# Patient Record
Sex: Male | Born: 1965 | Race: White | Hispanic: No | Marital: Single | Smoking: Current every day smoker
Health system: Southern US, Community
[De-identification: ages and names within clinical notes are randomized; demographics above are authoritative.]

## PROBLEM LIST (undated history)

## (undated) DIAGNOSIS — F329 Major depressive disorder, single episode, unspecified: Secondary | ICD-10-CM

## (undated) DIAGNOSIS — I10 Essential (primary) hypertension: Secondary | ICD-10-CM

## (undated) DIAGNOSIS — J449 Chronic obstructive pulmonary disease, unspecified: Secondary | ICD-10-CM

## (undated) DIAGNOSIS — F419 Anxiety disorder, unspecified: Secondary | ICD-10-CM

## (undated) DIAGNOSIS — F209 Schizophrenia, unspecified: Secondary | ICD-10-CM

## (undated) DIAGNOSIS — J189 Pneumonia, unspecified organism: Secondary | ICD-10-CM

## (undated) DIAGNOSIS — T148XXA Other injury of unspecified body region, initial encounter: Secondary | ICD-10-CM

## (undated) DIAGNOSIS — F1010 Alcohol abuse, uncomplicated: Secondary | ICD-10-CM

## (undated) DIAGNOSIS — K759 Inflammatory liver disease, unspecified: Secondary | ICD-10-CM

## (undated) DIAGNOSIS — F32A Depression, unspecified: Secondary | ICD-10-CM

## (undated) HISTORY — PX: APPENDECTOMY: SHX54

## (undated) HISTORY — DX: Alcohol abuse, uncomplicated: F10.10

## (undated) HISTORY — DX: Essential (primary) hypertension: I10

## (undated) HISTORY — PX: TONSILLECTOMY: SUR1361

---
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
## 1989-01-21 DIAGNOSIS — K759 Inflammatory liver disease, unspecified: Secondary | ICD-10-CM

## 1989-01-21 HISTORY — DX: Inflammatory liver disease, unspecified: K75.9

## 2011-06-08 ENCOUNTER — Encounter (HOSPITAL_COMMUNITY): Payer: Self-pay | Admitting: *Deleted

## 2011-06-08 ENCOUNTER — Emergency Department (HOSPITAL_COMMUNITY): Payer: Medicaid Other

## 2011-06-08 ENCOUNTER — Inpatient Hospital Stay (HOSPITAL_COMMUNITY)
Admission: EM | Admit: 2011-06-08 | Discharge: 2011-06-12 | DRG: 513 | Disposition: A | Discharge status: Home / Self Care | Payer: Medicaid Other | Attending: Internal Medicine | Admitting: Internal Medicine

## 2011-06-08 DIAGNOSIS — L03113 Cellulitis of right upper limb: Secondary | ICD-10-CM

## 2011-06-08 DIAGNOSIS — F17200 Nicotine dependence, unspecified, uncomplicated: Secondary | ICD-10-CM | POA: Diagnosis present

## 2011-06-08 DIAGNOSIS — M869 Osteomyelitis, unspecified: Principal | ICD-10-CM

## 2011-06-08 DIAGNOSIS — M65839 Other synovitis and tenosynovitis, unspecified forearm: Secondary | ICD-10-CM | POA: Diagnosis present

## 2011-06-08 DIAGNOSIS — F411 Generalized anxiety disorder: Secondary | ICD-10-CM | POA: Diagnosis present

## 2011-06-08 DIAGNOSIS — J449 Chronic obstructive pulmonary disease, unspecified: Secondary | ICD-10-CM | POA: Diagnosis present

## 2011-06-08 DIAGNOSIS — F3289 Other specified depressive episodes: Secondary | ICD-10-CM | POA: Diagnosis present

## 2011-06-08 DIAGNOSIS — B1910 Unspecified viral hepatitis B without hepatic coma: Secondary | ICD-10-CM | POA: Diagnosis present

## 2011-06-08 DIAGNOSIS — L02519 Cutaneous abscess of unspecified hand: Secondary | ICD-10-CM

## 2011-06-08 DIAGNOSIS — L03119 Cellulitis of unspecified part of limb: Secondary | ICD-10-CM | POA: Diagnosis present

## 2011-06-08 DIAGNOSIS — F32A Depression, unspecified: Secondary | ICD-10-CM | POA: Diagnosis present

## 2011-06-08 DIAGNOSIS — F209 Schizophrenia, unspecified: Secondary | ICD-10-CM | POA: Diagnosis present

## 2011-06-08 DIAGNOSIS — J4489 Other specified chronic obstructive pulmonary disease: Secondary | ICD-10-CM | POA: Diagnosis present

## 2011-06-08 DIAGNOSIS — A4902 Methicillin resistant Staphylococcus aureus infection, unspecified site: Secondary | ICD-10-CM | POA: Diagnosis present

## 2011-06-08 DIAGNOSIS — F329 Major depressive disorder, single episode, unspecified: Secondary | ICD-10-CM | POA: Diagnosis present

## 2011-06-08 HISTORY — DX: Major depressive disorder, single episode, unspecified: F32.9

## 2011-06-08 HISTORY — DX: Anxiety disorder, unspecified: F41.9

## 2011-06-08 HISTORY — PX: I & D EXTREMITY: SHX5045

## 2011-06-08 HISTORY — DX: Depression, unspecified: F32.A

## 2011-06-08 HISTORY — DX: Other injury of unspecified body region, initial encounter: T14.8XXA

## 2011-06-08 HISTORY — DX: Pneumonia, unspecified organism: J18.9

## 2011-06-08 HISTORY — DX: Inflammatory liver disease, unspecified: K75.9

## 2011-06-08 HISTORY — DX: Chronic obstructive pulmonary disease, unspecified: J44.9

## 2011-06-08 HISTORY — DX: Schizophrenia, unspecified: F20.9

## 2011-06-08 LAB — BASIC METABOLIC PANEL
BUN: 14 mg/dL (ref 6–23)
Chloride: 105 mEq/L (ref 96–112)
Creatinine, Ser: 1.17 mg/dL (ref 0.50–1.35)
GFR calc Af Amer: 84 mL/min — ABNORMAL LOW (ref >90)
GFR calc non Af Amer: 73 mL/min — ABNORMAL LOW (ref >90)
Glucose, Bld: 176 mg/dL — ABNORMAL HIGH (ref 70–99)
Potassium: 4.3 mEq/L (ref 3.5–5.1)

## 2011-06-08 LAB — CBC
HCT: 44.8 % (ref 39.0–52.0)
Hemoglobin: 15.2 g/dL (ref 13.0–17.0)
MCH: 27.4 pg (ref 26.0–34.0)
MCHC: 33.9 g/dL (ref 30.0–36.0)
RBC: 5.54 MIL/uL (ref 4.22–5.81)

## 2011-06-08 LAB — DIFFERENTIAL
Basophils Relative: 0 % (ref 0–1)
Eosinophils Absolute: 0.5 10*3/uL (ref 0.0–0.7)
Lymphs Abs: 1.5 10*3/uL (ref 0.7–4.0)
Monocytes Absolute: 0.7 10*3/uL (ref 0.1–1.0)
Monocytes Relative: 7 % (ref 3–12)
Neutrophils Relative %: 72 % (ref 43–77)

## 2011-06-08 LAB — WOUND CULTURE: Gram Stain: NONE SEEN

## 2011-06-08 MED ORDER — PIPERACILLIN-TAZOBACTAM 3.375 G IVPB
3.3750 g | Freq: Once | INTRAVENOUS | Status: AC
  Administered 2011-06-08: 3.375 g via INTRAVENOUS
  Filled 2011-06-08: qty 50

## 2011-06-08 MED ORDER — MORPHINE SULFATE 4 MG/ML IJ SOLN
4.0000 mg | Freq: Once | INTRAMUSCULAR | Status: AC
  Administered 2011-06-08: 4 mg via INTRAVENOUS
  Filled 2011-06-08: qty 1

## 2011-06-08 MED ORDER — OLANZAPINE 10 MG PO TABS
20.0000 mg | ORAL_TABLET | Freq: Two times a day (BID) | ORAL | Status: DC
  Administered 2011-06-08 – 2011-06-12 (×7): 20 mg via ORAL
  Filled 2011-06-08 (×9): qty 2

## 2011-06-08 MED ORDER — MIRTAZAPINE 15 MG PO TABS
15.0000 mg | ORAL_TABLET | Freq: Every day | ORAL | Status: DC
  Administered 2011-06-09 – 2011-06-11 (×3): 15 mg via ORAL
  Filled 2011-06-08 (×4): qty 1

## 2011-06-08 MED ORDER — ALBUTEROL SULFATE (5 MG/ML) 0.5% IN NEBU
2.5000 mg | INHALATION_SOLUTION | RESPIRATORY_TRACT | Status: DC | PRN
  Administered 2011-06-10 – 2011-06-12 (×6): 2.5 mg via RESPIRATORY_TRACT
  Filled 2011-06-08 (×6): qty 0.5

## 2011-06-08 MED ORDER — BACLOFEN 10 MG PO TABS
10.0000 mg | ORAL_TABLET | Freq: Three times a day (TID) | ORAL | Status: DC
  Administered 2011-06-08 – 2011-06-12 (×10): 10 mg via ORAL
  Filled 2011-06-08 (×13): qty 1

## 2011-06-08 MED ORDER — ONDANSETRON HCL 4 MG PO TABS: 4.0000 mg | ORAL_TABLET | Freq: Four times a day (QID) | ORAL | Status: DC | PRN

## 2011-06-08 MED ORDER — SALSALATE 500 MG PO TABS
500.0000 mg | ORAL_TABLET | Freq: Three times a day (TID) | ORAL | Status: DC
  Administered 2011-06-08 – 2011-06-12 (×10): 500 mg via ORAL
  Filled 2011-06-08 (×13): qty 1

## 2011-06-08 MED ORDER — GABAPENTIN 300 MG PO CAPS
300.0000 mg | ORAL_CAPSULE | Freq: Four times a day (QID) | ORAL | Status: DC
  Administered 2011-06-08 – 2011-06-12 (×13): 300 mg via ORAL
  Filled 2011-06-08 (×17): qty 1

## 2011-06-08 MED ORDER — OXYCODONE HCL 5 MG PO TABS
5.0000 mg | ORAL_TABLET | ORAL | Status: DC | PRN
  Administered 2011-06-09 – 2011-06-11 (×6): 5 mg via ORAL
  Filled 2011-06-08 (×5): qty 1

## 2011-06-08 MED ORDER — ACETAMINOPHEN 650 MG RE SUPP: 650.0000 mg | Freq: Four times a day (QID) | RECTAL | Status: DC | PRN

## 2011-06-08 MED ORDER — ONDANSETRON HCL 4 MG/2ML IJ SOLN: 4.0000 mg | Freq: Four times a day (QID) | INTRAMUSCULAR | Status: DC | PRN

## 2011-06-08 MED ORDER — VANCOMYCIN HCL IN DEXTROSE 1-5 GM/200ML-% IV SOLN
1000.0000 mg | Freq: Once | INTRAVENOUS | Status: AC
  Administered 2011-06-08: 1000 mg via INTRAVENOUS
  Filled 2011-06-08: qty 200

## 2011-06-08 MED ORDER — CITALOPRAM HYDROBROMIDE 20 MG PO TABS
20.0000 mg | ORAL_TABLET | Freq: Every day | ORAL | Status: DC
  Administered 2011-06-10 – 2011-06-12 (×3): 20 mg via ORAL
  Filled 2011-06-08 (×4): qty 1

## 2011-06-08 MED ORDER — VANCOMYCIN HCL 1000 MG IV SOLR
750.0000 mg | Freq: Two times a day (BID) | INTRAVENOUS | Status: DC
  Administered 2011-06-09 – 2011-06-12 (×7): 750 mg via INTRAVENOUS
  Filled 2011-06-08 (×9): qty 750

## 2011-06-08 MED ORDER — ACETAMINOPHEN 325 MG PO TABS
650.0000 mg | ORAL_TABLET | Freq: Four times a day (QID) | ORAL | Status: DC | PRN
  Administered 2011-06-09 – 2011-06-11 (×2): 650 mg via ORAL
  Filled 2011-06-08 (×2): qty 2

## 2011-06-08 MED ORDER — HYDROMORPHONE HCL PF 1 MG/ML IJ SOLN
2.0000 mg | INTRAMUSCULAR | Status: DC | PRN
  Administered 2011-06-08 – 2011-06-10 (×8): 2 mg via INTRAVENOUS
  Filled 2011-06-08: qty 1
  Filled 2011-06-08: qty 2
  Filled 2011-06-08: qty 1
  Filled 2011-06-08 (×6): qty 2

## 2011-06-08 MED ORDER — LIDOCAINE HCL (PF) 1 % IJ SOLN
INTRAMUSCULAR | Status: AC
  Filled 2011-06-08: qty 5

## 2011-06-08 MED ORDER — SODIUM CHLORIDE 0.9 % IV SOLN
INTRAVENOUS | Status: DC
  Administered 2011-06-08: 22:00:00 via INTRAVENOUS

## 2011-06-08 MED ORDER — PIPERACILLIN-TAZOBACTAM 3.375 G IVPB
3.3750 g | Freq: Three times a day (TID) | INTRAVENOUS | Status: DC
  Administered 2011-06-08 – 2011-06-11 (×8): 3.375 g via INTRAVENOUS
  Filled 2011-06-08 (×11): qty 50

## 2011-06-08 MED ORDER — CHLORPROMAZINE HCL 100 MG PO TABS
100.0000 mg | ORAL_TABLET | Freq: Every day | ORAL | Status: DC
  Administered 2011-06-09 – 2011-06-11 (×3): 100 mg via ORAL
  Filled 2011-06-08 (×4): qty 1

## 2011-06-08 NOTE — H&P (Signed)
PATIENT DETAILS Name: Joseph [name] Age: 46 y.o. Sex: male Date of Birth: 1965/10/25 Admit Date: 06/08/2011 PCP:No primary provider on file.   CHIEF COMPLAINT:  Right pain and swelling  HPI: Patient is a 46 year old Caucasian male with a past medical history of schizophrenia, who presented [facility] hospital emergency room earlier today with complaints of right hand pain and swelling. History of pain, patient has been having some mild erythema and pain of his right hand for at least 5 days now, but upon waking up today he notes that his hand was significantly more swollen and much more painful. He then proceeded to be evaluated in the emergency room at [facility], the emergency room physician that consulted the hand physician on call, after discussion with the on-call and physician, the hospitalist service was then asked to admit this patient. Patient was subsequently transferred to [facility], I've already spoken with hand M.D. on call-Dr. Maxine Glenn will evaluate the patient shortly. Patient denies any bites or stings or trauma to the right hand. He denies any fever. He denies any nausea vomiting or diarrhea.   ALLERGIES:  No Known Allergies  PAST MEDICAL HISTORY: Past Medical History  Diagnosis Date  . Schizophrenia   . Depression   . Anxiety   . Nerve damage     PAST SURGICAL HISTORY: History reviewed. No pertinent past surgical history.  MEDICATIONS AT HOME: Prior to Admission medications   Medication Sig Start Date End Date Taking? Authorizing Provider  baclofen (LIORESAL) 10 MG tablet Take 10 mg by mouth 3 (three) times daily.   Yes Historical Provider, MD  chlorproMAZINE (THORAZINE) 100 MG tablet Take 100 mg by mouth at bedtime.   Yes Historical Provider, MD  citalopram (CELEXA) 20 MG tablet Take 20 mg by mouth daily.   Yes Historical Provider, MD  clonazePAM (KLONOPIN) 0.5 MG tablet Take 0.5 mg by mouth 3 (three) times daily as needed. For anxiety   Yes  Historical Provider, MD  gabapentin (NEURONTIN) 300 MG capsule Take 300 mg by mouth 4 (four) times daily.   Yes Historical Provider, MD  HYDROcodone-acetaminophen (LORTAB) 10-500 MG per tablet Take 1 tablet by mouth at bedtime.   Yes Historical Provider, MD  hydrOXYzine (VISTARIL) 50 MG capsule Take 50-100 mg by mouth at bedtime as needed. For sleep/anxiety   Yes Historical Provider, MD  ibuprofen (ADVIL,MOTRIN) 800 MG tablet Take 800 mg by mouth 3 (three) times daily.   Yes Historical Provider, MD  mirtazapine (REMERON) 15 MG tablet Take 15 mg by mouth at bedtime.   Yes Historical Provider, MD  OLANZapine (ZYPREXA) 20 MG tablet Take 20 mg by mouth 2 (two) times daily.   Yes Historical Provider, MD  salsalate (DISALCID) 500 MG tablet Take 500 mg by mouth 3 (three) times daily.   Yes Historical Provider, MD    FAMILY HISTORY: No family history on file.  SOCIAL HISTORY:  reports that he has been smoking.  He does not have any smokeless tobacco history on file. He reports that he drinks alcohol. He reports that he does not use illicit drugs.  REVIEW OF SYSTEMS:  Constitutional:   No  weight loss, night sweats,  Fevers, chills, fatigue.  HEENT:    No headaches, Difficulty swallowing,Tooth/dental problems,Sore throat,  No sneezing, itching, ear ache, nasal congestion, post nasal drip,   Cardio-vascular: No chest pain,  Orthopnea, PND, swelling in lower extremities, anasarca, dizziness, palpitations  GI:  No heartburn, indigestion, abdominal pain, nausea, vomiting, diarrhea, change  in bowel habits, loss of appetite  Resp: No shortness of breath with exertion or at rest.  No excess mucus, no productive cough, No non-productive cough,  No coughing up of blood.No change in color of mucus.No wheezing.No chest wall deformity  Skin:  no rash or lesions.  GU:  no dysuria, change in color of urine, no urgency or frequency.  No flank pain.  Musculoskeletal: No joint pain or swelling.    No  back pain.  Psych: No change in mood or affect. No depression or anxiety.  No memory loss.   PHYSICAL EXAM: Blood pressure 149/90, pulse 94, temperature 98.7 F (37.1 C), temperature source Oral, resp. rate 18, height 5\' 5"  (1.651 m), weight 65.772 kg (145 lb), SpO2 93.00%.  General appearance :Awake, alert, not in any distress. Speech Clear. Not toxic Looking HEENT: Atraumatic and Normocephalic, pupils equally reactive to light and accomodation Neck: supple, no JVD. No cervical lymphadenopathy.  Chest:Good air entry bilaterally, no added sounds  CVS: S1 S2 regular, no murmurs.  Abdomen: Bowel sounds present, Non tender and not distended with no gaurding, rigidity or rebound. Extremities: B/L Lower Ext shows no edema, both legs are warm to touch, with  dorsalis pedis pulses palpable. -Right hand area-on the dorsal aspect shows significant swelling, erythema and increased warmth. On the lateral aspect there is an area of fluctuance. Erythema and some swelling extending into the dorsal aspect of the wrist as well. There is decreased range of motion predominantly from pain. Capillary refill is intact. Sensation is intact. Neurology: Awake alert, and oriented X 3, CN II-XII intact, Non focal, Deep Tendon Reflex-2+ all over, plantar's downgoing B/L, sensory exam is grossly intact.  Skin:No Rash Wounds:N/A  LABS ON ADMISSION:   Basename 06/08/11 1339  NA 141  K 4.3  CL 105  CO2 29  GLUCOSE 176*  BUN 14  CREATININE 1.17  CALCIUM 10.1  MG --  PHOS --   No results found for this basename: AST:2,ALT:2,ALKPHOS:2,BILITOT:2,PROT:2,ALBUMIN:2 in the last 72 hours No results found for this basename: LIPASE:2,AMYLASE:2 in the last 72 hours  Basename 06/08/11 1339  WBC 9.7  NEUTROABS 7.0  HGB 15.2  HCT 44.8  MCV 80.9  PLT 199    Basename 06/08/11 1339  CKTOTAL 73  CKMB --  CKMBINDEX --  TROPONINI --   No results found for this basename: DDIMER:2 in the last 72 hours No components  found with this basename: POCBNP:3   RADIOLOGIC STUDIES ON ADMISSION: Dg Hand Complete Right  06/08/2011  *RADIOLOGY REPORT*  Clinical Data:  Increased swelling and warmth of right hand, no known injury  RIGHT HAND - COMPLETE 3+ VIEW  Comparison: None  Findings: Significant soft tissue swelling, particularly at dorsal margin of right hand extending to the ulnar border at the level of the distal fifth metacarpal and fifth MCP joint. Osseous mineralization normal for technique. Joint spaces preserved. No acute fracture or dislocation. Flexion deformities of the DIP joints of the right index and little fingers  Focal area of bony destruction identified at ulnar margin of distal fifth metacarpal. Distal aspect of lesion is 3 mm beyond the articular margin, atypically removed from the joint space for gout. Overall lesion measures approximately 10 x 5 mm in size and shows a questionable tiny Codman triangle at distal margin. No other regional bony abnormalities identified.  IMPRESSION: Flexion deformities of the ring and little fingers, question related to remote tendon injury or current pathology, recommend clinical correlation. Marked soft tissue swelling.  Lytic osseous lesion identified at ulnar margin, distal right fifth metacarpal. This could represent a bone tumor, with appearance less typical for osteomyelitis or gout. The soft tissue swelling may or may not be related to the observed fifth metacarpal bony lesion. Consider MR imaging of the right hand with and without contrast to further evaluate.  Original Report Authenticated By: Lollie Marrow, M.D.    ASSESSMENT AND PLAN: Present on Admission:   #1 right hand infection -Given fluctuance-likely underlying abscess. -Await hand surgery evaluation, in the meantime keep patient n.p.o. -We'll continue with vancomycin and Zosyn-dosed per pharmacy  #2 schizophrenia -Stable -Hold all oral antipsychotics for now as patient n.p.o. for potential  incision  and drainage. Resume when I&D is done.  Further plan will depend as patient's clinical course evolves and further radiologic and laboratory data become available. Patient will be monitored closely.  DVT Prophylaxis: Bilateral SCDs for now.   Code Status: full code   Total time spent for admission equals 45 minutes.  Jeoffrey Massed 06/08/2011, 9:16 PM

## 2011-06-08 NOTE — H&P (Signed)
Reason for Consult:infection R hand Referring Physician: [facility] ER  Joseph [name] is an 46 y.o. left handed male.  HPI: Pt states R hand swollen and painful since Friday, progressively worse, denies wound, denies trauma, denies fever / chills.  Presented to [facility], transferred to [facility], has rec'd Zosyn, Vanc.  Past Medical History  Diagnosis Date  . Schizophrenia   . Depression   . Anxiety   . Nerve damage     PSH: R hand - ttendon repair with flexion contractures, Appendectomy, L knee sx  No family history on file.  Social History:  reports that he has been smoking.  He does not have any smokeless tobacco history on file. He reports that he drinks alcohol. He reports that he does not use illicit drugs.  Allergies: No Known Allergies  Medications: I have reviewed the patient's current medications.  Results for orders placed during the hospital encounter of 06/08/11 (from the past 48 hour(s))  CBC     Status: Normal   Collection Time   06/08/11  1:39 PM      Component Value Range Comment   WBC 9.7  4.0 - 10.5 (K/uL)    RBC 5.54  4.22 - 5.81 (MIL/uL)    Hemoglobin 15.2  13.0 - 17.0 (g/dL)    HCT 16.1  09.6 - 04.5 (%)    MCV 80.9  78.0 - 100.0 (fL)    MCH 27.4  26.0 - 34.0 (pg)    MCHC 33.9  30.0 - 36.0 (g/dL)    RDW 40.9  81.1 - 91.4 (%)    Platelets 199  150 - 400 (K/uL)   DIFFERENTIAL     Status: Normal   Collection Time   06/08/11  1:39 PM      Component Value Range Comment   Neutrophils Relative 72  43 - 77 (%)    Neutro Abs 7.0  1.7 - 7.7 (K/uL)    Lymphocytes Relative 16  12 - 46 (%)    Lymphs Abs 1.5  0.7 - 4.0 (K/uL)    Monocytes Relative 7  3 - 12 (%)    Monocytes Absolute 0.7  0.1 - 1.0 (K/uL)    Eosinophils Relative 5  0 - 5 (%)    Eosinophils Absolute 0.5  0.0 - 0.7 (K/uL)    Basophils Relative 0  0 - 1 (%)    Basophils Absolute 0.0  0.0 - 0.1 (K/uL)   BASIC METABOLIC PANEL     Status: Abnormal   Collection Time   06/08/11  1:39 PM      Component Value Range Comment    Sodium 141  135 - 145 (mEq/L)    Potassium 4.3  3.5 - 5.1 (mEq/L)    Chloride 105  96 - 112 (mEq/L)    CO2 29  19 - 32 (mEq/L)    Glucose, Bld 176 (*) 70 - 99 (mg/dL)    BUN 14  6 - 23 (mg/dL)    Creatinine, Ser 7.82  0.50 - 1.35 (mg/dL)    Calcium 95.6  8.4 - 10.5 (mg/dL)    GFR calc non Af Amer 73 (*) >90 (mL/min)    GFR calc Af Amer 84 (*) >90 (mL/min)   CK     Status: Normal   Collection Time   06/08/11  1:39 PM      Component Value Range Comment   Total CK 73  7 - 232 (U/L)   LACTIC ACID, PLASMA     Status: Normal  Collection Time   06/08/11  1:42 PM      Component Value Range Comment   Lactic Acid, Venous 1.2  0.5 - 2.2 (mmol/L)     Dg Hand Complete Right  06/08/2011  *RADIOLOGY REPORT*  Clinical Data:  Increased swelling and warmth of right hand, no known injury  RIGHT HAND - COMPLETE 3+ VIEW  Comparison: None  Findings: Significant soft tissue swelling, particularly at dorsal margin of right hand extending to the ulnar border at the level of the distal fifth metacarpal and fifth MCP joint. Osseous mineralization normal for technique. Joint spaces preserved. No acute fracture or dislocation. Flexion deformities of the DIP joints of the right index and little fingers  Focal area of bony destruction identified at ulnar margin of distal fifth metacarpal. Distal aspect of lesion is 3 mm beyond the articular margin, atypically removed from the joint space for gout. Overall lesion measures approximately 10 x 5 mm in size and shows a questionable tiny Codman triangle at distal margin. No other regional bony abnormalities identified.  IMPRESSION: Flexion deformities of the ring and little fingers, question related to remote tendon injury or current pathology, recommend clinical correlation. Marked soft tissue swelling. Lytic osseous lesion identified at ulnar margin, distal right fifth metacarpal. This could represent a bone tumor, with appearance less typical for osteomyelitis or gout. The  soft tissue swelling may or may not be related to the observed fifth metacarpal bony lesion. Consider MR imaging of the right hand with and without contrast to further evaluate.  Original Report Authenticated By: Lollie Marrow, M.D.    Constitutional: negative Eyes: negative for icterus Respiratory: positive for cough, dyspnea on exertion and sputum Cardiovascular: negative Gastrointestinal: negative Musculoskeletal:positive for stiff joints and h/o l knee surgery, h/o R hand surgery with flexion contractures Neurological: positive for paresthesia Behavioral/Psych: positive for depression and schizophrenia Temp:  [98.6 F (37 C)-98.7 F (37.1 C)] 98.7 F (37.1 C) (01/16 2000) Pulse Rate:  [94-104] 94  (01/16 2000) Resp:  [18] 18  (01/16 2000) BP: (149-162)/(89-90) 149/90 mmHg (01/16 2000) SpO2:  [93 %-97 %] 93 % (01/16 2000) Weight:  [65.772 kg (145 lb)] 65.772 kg (145 lb) (01/16 1241) General appearance: alert, appears older than stated age and no distress Resp: diminished breath sounds anterior - bilateral and bilaterally Cardio: regular rate and rhythm GI: normal findings: bowel sounds normal and soft, non-tender Extremities: edema R hand with moderate edema, pain to palpation mostly on dorsum, fluctuance just above 5th mc head, flexion contractures of fingers, iwth volar scar(flexor tendon repair), fingertips pink, gross sensation intact forearm with mild erythema   Assessment/Plan: Cellulitis, abscess, tenosynovitis R hand  Will i&d at bedside, pack, if no better will formally i&d in OR in am, cont BS abx  Joseph [name] Joseph [name] 06/08/2011, 9:49 PM

## 2011-06-08 NOTE — ED Notes (Signed)
Rt hand swollen, red, painful, no injury.

## 2011-06-08 NOTE — Progress Notes (Signed)
ANTIBIOTIC CONSULT NOTE - INITIAL  Pharmacy Consult for Vancomycin and Zosyn Indication: left hand infection  Assessment: 46 yo M who presented to the [facility] ED for R hand pain and swelling for at least 5 days. Hand surgeon has been consulted and plans for I&D at bedside tomorrow.  Patient received a dose of Zosyn 3.375 g IV at 15:09 and vancomycin 1000 mg IV at 13:53 today.  Goal of Therapy:  Vancomycin trough level 10-15 mcg/ml  Plan:  1. Vancomycin 750 mg IV q12h 2. Zosyn 3.375 g IV q8h 3. Will follow-up renal fxn and any culture data  No Known Allergies  Patient Measurements: Height: 5\' 5"  (165.1 cm) Weight: 145 lb (65.772 kg) IBW/kg (Calculated) : 61.5   Vital Signs: Temp: 98.7 F (37.1 C) (01/16 2000) Temp src: Oral (01/16 2000) BP: 149/90 mmHg (01/16 2000) Pulse Rate: 94  (01/16 2000) Intake/Output from previous day:   Intake/Output from this shift:    Labs:  Basename 06/08/11 1339  WBC 9.7  HGB 15.2  PLT 199  LABCREA --  CREATININE 1.17   Estimated Creatinine Clearance: 67.9 ml/min (by C-G formula based on Cr of 1.17). No results found for this basename: VANCOTROUGH:2,VANCOPEAK:2,VANCORANDOM:2,GENTTROUGH:2,GENTPEAK:2,GENTRANDOM:2,TOBRATROUGH:2,TOBRAPEAK:2,TOBRARND:2,AMIKACINPEAK:2,AMIKACINTROU:2,AMIKACIN:2, in the last 72 hours   Microbiology: No results found for this or any previous visit (from the past 720 hour(s)).  Medical History: Past Medical History  Diagnosis Date  . Schizophrenia   . Depression   . Anxiety   . Nerve damage     Medications:  Prescriptions prior to admission  Medication Sig Dispense Refill  . baclofen (LIORESAL) 10 MG tablet Take 10 mg by mouth 3 (three) times daily.      . chlorproMAZINE (THORAZINE) 100 MG tablet Take 100 mg by mouth at bedtime.      . citalopram (CELEXA) 20 MG tablet Take 20 mg by mouth daily.      . clonazePAM (KLONOPIN) 0.5 MG tablet Take 0.5 mg by mouth 3 (three) times daily as needed. For anxiety       . gabapentin (NEURONTIN) 300 MG capsule Take 300 mg by mouth 4 (four) times daily.      Marland Kitchen HYDROcodone-acetaminophen (LORTAB) 10-500 MG per tablet Take 1 tablet by mouth at bedtime.      . hydrOXYzine (VISTARIL) 50 MG capsule Take 50-100 mg by mouth at bedtime as needed. For sleep/anxiety      . ibuprofen (ADVIL,MOTRIN) 800 MG tablet Take 800 mg by mouth 3 (three) times daily.      . mirtazapine (REMERON) 15 MG tablet Take 15 mg by mouth at bedtime.      Marland Kitchen OLANZapine (ZYPREXA) 20 MG tablet Take 20 mg by mouth 2 (two) times daily.      . salsalate (DISALCID) 500 MG tablet Take 500 mg by mouth 3 (three) times daily.       Scheduled:    . baclofen  10 mg Oral TID  . chlorproMAZINE  100 mg Oral QHS  . citalopram  20 mg Oral Daily  . gabapentin  300 mg Oral QID  . lidocaine      . mirtazapine  15 mg Oral QHS  .  morphine injection  4 mg Intravenous Once  .  morphine injection  4 mg Intravenous Once  .  morphine injection  4 mg Intravenous Once  .  morphine injection  4 mg Intravenous Once  . OLANZapine  20 mg Oral BID  . piperacillin-tazobactam (ZOSYN)  IV  3.375 g Intravenous Once  .  salsalate  500 mg Oral TID  . vancomycin  1,000 mg Intravenous Once    [place] Danielle 06/08/2011,10:05 PM

## 2011-06-08 NOTE — ED Notes (Signed)
Pt left via EMS with [facility]. Report given.

## 2011-06-08 NOTE — ED Provider Notes (Signed)
History     CSN: [id number]  Arrival date & time 06/08/11  1225   First MD Initiated Contact with Patient 06/08/11 1338      Chief Complaint  Patient presents with  . Hand Problem     Patient is a 46 y.o. male presenting with hand pain. The history is provided by the patient and a relative.  Hand Pain This is a new problem. The current episode started more than 2 days ago. The problem occurs constantly. The problem has been gradually worsening. Pertinent negatives include no chest pain and no abdominal pain. The symptoms are aggravated by bending and twisting. The symptoms are relieved by nothing.  denies fever/chills/weakness/vomiting Denies trauma to hand No known injuries/bites/stings Reports distant h/o surgery to right ring/little finger with chronic deformity  PMH - "mental problems" per patient and family  History reviewed. No pertinent past surgical history.  No family history on file.  History  Substance Use Topics  . Smoking status: Current Everyday Smoker  . Smokeless tobacco: Not on file  . Alcohol Use: Yes      Review of Systems  Cardiovascular: Negative for chest pain.  Gastrointestinal: Negative for abdominal pain.  All other systems reviewed and are negative.    Allergies  Review of patient's allergies indicates no known allergies.  Home Medications  No current outpatient prescriptions on file.  BP 162/89  Pulse 104  Temp(Src) 98.6 F (37 C) (Oral)  Resp 18  Ht 5\' 5"  (1.651 m)  Wt 145 lb (65.772 kg)  BMI 24.13 kg/m2  SpO2 97%  Physical Exam CONSTITUTIONAL: Well developed/well nourished HEAD AND FACE: Normocephalic/atraumatic EYES: EOMI/PERRL ENMT: Mucous membranes moist NECK: supple no meningeal signs SPINE:entire spine nontender CV: S1/S2 noted, no murmurs/rubs/gallops noted LUNGS: coarse BS noted bilaterally, no distress ABDOMEN: soft, nontender, no rebound or guarding GU:no cva tenderness NEURO: Pt is awake/alert, moves all  extremitiesx4 EXTREMITIES: pulses normal, full ROM Erythema/edema noted to dorsal aspect of right hand with tenderness noted.  No open skin is noted.  There is no erythema noted to forearm He has full ROM of all fingers on right hand and distal cap refill less than 2 seconds.  There is some erythema encroaching onto palmar surface No crepitance noted to palpation of hand SKIN: warm, color normal PSYCH: no abnormalities of mood noted  ED Course  Procedures   Labs Reviewed  CBC  DIFFERENTIAL  BASIC METABOLIC PANEL  LACTIC ACID, PLASMA  CK   2:04 PM Pt with erythema/tenderness to right hand, suspect infectious etiology of unclear cause Will start antibiotics while xray/labs pending  3:48 PM Discussed xray and physical exam findings with dr Izora Ribas at [facility] He requests transfer to [facility], and to admit to hospitalist  4:15 PM D/w dr Jerral Ralph, medicine will accept in transfer  MDM  Nursing notes reviewed and considered in documentation All labs/vitals reviewed and considered xrays reviewed and considered         Joya Gaskins, MD 06/08/11 1616

## 2011-06-08 NOTE — ED Notes (Signed)
Pt awaiting transport by [facility]. Report given to [facility]. Transfer forms printed.

## 2011-06-09 ENCOUNTER — Encounter (HOSPITAL_COMMUNITY): Payer: Self-pay | Admitting: General Practice

## 2011-06-09 ENCOUNTER — Inpatient Hospital Stay (HOSPITAL_COMMUNITY): Payer: Medicaid Other | Admitting: Anesthesiology

## 2011-06-09 ENCOUNTER — Encounter (HOSPITAL_COMMUNITY): Payer: Self-pay | Admitting: Anesthesiology

## 2011-06-09 ENCOUNTER — Encounter (HOSPITAL_COMMUNITY)
Admission: EM | Disposition: A | Discharge status: Home / Self Care | Payer: Self-pay | Source: Home / Self Care | Attending: Internal Medicine

## 2011-06-09 DIAGNOSIS — L02519 Cutaneous abscess of unspecified hand: Secondary | ICD-10-CM | POA: Diagnosis present

## 2011-06-09 DIAGNOSIS — F209 Schizophrenia, unspecified: Secondary | ICD-10-CM | POA: Diagnosis present

## 2011-06-09 DIAGNOSIS — F329 Major depressive disorder, single episode, unspecified: Secondary | ICD-10-CM | POA: Diagnosis present

## 2011-06-09 HISTORY — PX: I & D EXTREMITY: SHX5045

## 2011-06-09 LAB — COMPREHENSIVE METABOLIC PANEL
ALT: 26 U/L (ref 0–53)
Albumin: 3.1 g/dL — ABNORMAL LOW (ref 3.5–5.2)
Alkaline Phosphatase: 94 U/L (ref 39–117)
BUN: 14 mg/dL (ref 6–23)
Chloride: 103 mEq/L (ref 96–112)
Glucose, Bld: 134 mg/dL — ABNORMAL HIGH (ref 70–99)
Potassium: 3.8 mEq/L (ref 3.5–5.1)
Sodium: 137 mEq/L (ref 135–145)
Total Bilirubin: 0.7 mg/dL (ref 0.3–1.2)
Total Protein: 6.4 g/dL (ref 6.0–8.3)

## 2011-06-09 LAB — CBC
HCT: 41.4 % (ref 39.0–52.0)
Hemoglobin: 13.7 g/dL (ref 13.0–17.0)
MCHC: 33.1 g/dL (ref 30.0–36.0)
RDW: 14.2 % (ref 11.5–15.5)
WBC: 8.3 10*3/uL (ref 4.0–10.5)

## 2011-06-09 LAB — SURGICAL PCR SCREEN
MRSA, PCR: POSITIVE — AB
Staphylococcus aureus: POSITIVE — AB

## 2011-06-09 LAB — GLUCOSE, CAPILLARY: Glucose-Capillary: 107 mg/dL — ABNORMAL HIGH (ref 70–99)

## 2011-06-09 SURGERY — IRRIGATION AND DEBRIDEMENT EXTREMITY
Anesthesia: General | Site: Hand | Laterality: Right | Wound class: Dirty or Infected

## 2011-06-09 MED ORDER — PROPOFOL 10 MG/ML IV EMUL
INTRAVENOUS | Status: DC | PRN
  Administered 2011-06-09: 200 mg via INTRAVENOUS

## 2011-06-09 MED ORDER — MUPIROCIN 2 % EX OINT
1.0000 "application " | TOPICAL_OINTMENT | Freq: Two times a day (BID) | CUTANEOUS | Status: DC
  Administered 2011-06-09 – 2011-06-12 (×5): 1 via NASAL
  Filled 2011-06-09: qty 22

## 2011-06-09 MED ORDER — LACTATED RINGERS IV SOLN
INTRAVENOUS | Status: DC | PRN
  Administered 2011-06-09 (×2): via INTRAVENOUS

## 2011-06-09 MED ORDER — HYDROMORPHONE HCL PF 1 MG/ML IJ SOLN: 0.2500 mg | INTRAMUSCULAR | Status: DC | PRN

## 2011-06-09 MED ORDER — ONDANSETRON HCL 4 MG/2ML IJ SOLN
INTRAMUSCULAR | Status: DC | PRN
  Administered 2011-06-09: 4 mg via INTRAVENOUS

## 2011-06-09 MED ORDER — BUPIVACAINE HCL (PF) 0.25 % IJ SOLN
INTRAMUSCULAR | Status: DC | PRN
  Administered 2011-06-09: 10 mL

## 2011-06-09 MED ORDER — FENTANYL CITRATE 0.05 MG/ML IJ SOLN
INTRAMUSCULAR | Status: DC | PRN
  Administered 2011-06-09 (×2): 100 ug via INTRAVENOUS

## 2011-06-09 MED ORDER — CHLORHEXIDINE GLUCONATE CLOTH 2 % EX PADS
6.0000 | MEDICATED_PAD | Freq: Every day | CUTANEOUS | Status: DC
  Administered 2011-06-09 – 2011-06-11 (×3): 6 via TOPICAL

## 2011-06-09 MED ORDER — SODIUM CHLORIDE 0.9 % IR SOLN
Status: DC | PRN
  Administered 2011-06-09: 1000 mL

## 2011-06-09 MED ORDER — ONDANSETRON HCL 4 MG/2ML IJ SOLN: 4.0000 mg | Freq: Once | INTRAMUSCULAR | Status: DC | PRN

## 2011-06-09 MED ORDER — LIDOCAINE HCL (CARDIAC) 20 MG/ML IV SOLN
INTRAVENOUS | Status: DC | PRN
  Administered 2011-06-09: 50 mg via INTRAVENOUS

## 2011-06-09 SURGICAL SUPPLY — 40 items
BAG DECANTER FOR FLEXI CONT (MISCELLANEOUS) IMPLANT
BANDAGE ELASTIC 3 VELCRO ST LF (GAUZE/BANDAGES/DRESSINGS) IMPLANT
BANDAGE ELASTIC 4 VELCRO ST LF (GAUZE/BANDAGES/DRESSINGS) IMPLANT
BANDAGE GAUZE ELAST BULKY 4 IN (GAUZE/BANDAGES/DRESSINGS) ×2 IMPLANT
BNDG ELASTIC 2 VLCR STRL LF (GAUZE/BANDAGES/DRESSINGS) IMPLANT
CLOTH BEACON ORANGE TIMEOUT ST (SAFETY) ×2 IMPLANT
CORDS BIPOLAR (ELECTRODE) IMPLANT
CUFF TOURNIQUET SINGLE 18IN (TOURNIQUET CUFF) IMPLANT
DRAPE SURG 17X23 STRL (DRAPES) ×2 IMPLANT
DRSG XEROFORM 1X8 (GAUZE/BANDAGES/DRESSINGS) ×2 IMPLANT
ELECT REM PT RETURN 9FT ADLT (ELECTROSURGICAL)
ELECTRODE REM PT RTRN 9FT ADLT (ELECTROSURGICAL) IMPLANT
GAUZE PACKING IODOFORM 1/4X5 (PACKING) ×2 IMPLANT
GAUZE XEROFORM 1X8 LF (GAUZE/BANDAGES/DRESSINGS) ×2 IMPLANT
GLOVE BIO SURGEON STRL SZ 6.5 (GLOVE) ×2 IMPLANT
GLOVE ORTHO TXT STRL SZ7.5 (GLOVE) ×4 IMPLANT
GOWN STRL NON-REIN LRG LVL3 (GOWN DISPOSABLE) ×6 IMPLANT
HANDPIECE INTERPULSE COAX TIP (DISPOSABLE)
KIT BASIN OR (CUSTOM PROCEDURE TRAY) ×2 IMPLANT
KIT ROOM TURNOVER OR (KITS) ×2 IMPLANT
MANIFOLD NEPTUNE II (INSTRUMENTS) ×2 IMPLANT
NEEDLE HYPO 25GX1X1/2 BEV (NEEDLE) ×2 IMPLANT
NS IRRIG 1000ML POUR BTL (IV SOLUTION) ×2 IMPLANT
PACK ORTHO EXTREMITY (CUSTOM PROCEDURE TRAY) ×2 IMPLANT
PAD ARMBOARD 7.5X6 YLW CONV (MISCELLANEOUS) ×4 IMPLANT
PAD CAST 4YDX4 CTTN HI CHSV (CAST SUPPLIES) IMPLANT
PADDING CAST COTTON 4X4 STRL (CAST SUPPLIES)
SET HNDPC FAN SPRY TIP SCT (DISPOSABLE) IMPLANT
SOAP 2 % CHG 4 OZ (WOUND CARE) ×2 IMPLANT
SPONGE GAUZE 4X4 12PLY (GAUZE/BANDAGES/DRESSINGS) ×2 IMPLANT
SPONGE LAP 18X18 X RAY DECT (DISPOSABLE) IMPLANT
SPONGE LAP 4X18 X RAY DECT (DISPOSABLE) ×2 IMPLANT
SUT ETHILON 4 0 PS 2 18 (SUTURE) ×2 IMPLANT
SYR CONTROL 10ML LL (SYRINGE) ×2 IMPLANT
TOWEL OR 17X24 6PK STRL BLUE (TOWEL DISPOSABLE) ×2 IMPLANT
TOWEL OR 17X26 10 PK STRL BLUE (TOWEL DISPOSABLE) ×2 IMPLANT
TUBE ANAEROBIC SPECIMEN COL (MISCELLANEOUS) IMPLANT
TUBE CONNECTING 12X1/4 (SUCTIONS) ×2 IMPLANT
WATER STERILE IRR 1000ML POUR (IV SOLUTION) ×2 IMPLANT
YANKAUER SUCT BULB TIP NO VENT (SUCTIONS) ×2 IMPLANT

## 2011-06-09 NOTE — Anesthesia Preprocedure Evaluation (Addendum)
Anesthesia Evaluation  Patient identified by MRN, date of birth, ID band Patient awake    Reviewed: Allergy & Precautions, H&P , NPO status , Patient's Chart, lab work & pertinent test results, reviewed documented beta blocker date and time   Airway Mallampati: I TM Distance: >3 FB Neck ROM: full    Dental  (+) Edentulous Upper and Edentulous Lower   Pulmonary asthma , pneumonia , COPDCurrent Smoker,  clear to auscultation+ rhonchi        Cardiovascular neg cardio ROS regular Normal    Neuro/Psych PSYCHIATRIC DISORDERS Anxiety Schizophrenia Negative Neurological ROS  Negative Psych ROS   GI/Hepatic negative GI ROS, (+) Hepatitis -, B  Endo/Other  Negative Endocrine ROS  Renal/GU negative Renal ROS  Genitourinary negative   Musculoskeletal   Abdominal   Peds  Hematology negative hematology ROS (+)   Anesthesia Other Findings   Reproductive/Obstetrics                          Anesthesia Physical Anesthesia Plan  ASA: II  Anesthesia Plan: General LMA   Post-op Pain Management:    Induction: Intravenous  Airway Management Planned: LMA  Additional Equipment:   Intra-op Plan:   Post-operative Plan:   Informed Consent: I have reviewed the patients History and Physical, chart, labs and discussed the procedure including the risks, benefits and alternatives for the proposed anesthesia with the patient or authorized representative who has indicated his/her understanding and acceptance.   Dental advisory given  Plan Discussed with: Anesthesiologist, CRNA and Surgeon  Anesthesia Plan Comments:        Anesthesia Quick Evaluation

## 2011-06-09 NOTE — Progress Notes (Signed)
S: pt states hand is still swollen, uncomfortable  O:Blood pressure 112/58, pulse 87, temperature 97.7 F (36.5 C), temperature source Oral, resp. rate 20, height 5\' 5"  (1.651 m), weight 65.772 kg (145 lb), SpO2 93.00%. Results for orders placed during the hospital encounter of 06/08/11  WOUND CULTURE     Status: Normal (Preliminary result)   Collection Time   06/08/11 10:11 PM      Component Value Range Status Comment   Specimen Description WOUND HAND   Final    Special Requests NONE   Final    Gram Stain     Final    Value: NO WBC SEEN     NO SQUAMOUS EPITHELIAL CELLS SEEN     FEW GRAM POSITIVE COCCI     IN PAIRS   Culture NO GROWTH 1 DAY   Final    Report Status PENDING   Incomplete   SURGICAL PCR SCREEN     Status: Abnormal   Collection Time   06/09/11  2:28 AM      Component Value Range Status Comment   MRSA, PCR POSITIVE (*) NEGATIVE  Final    Staphylococcus aureus POSITIVE (*) NEGATIVE  Final    R Hand: swollen, erythema present  A:Cellulitis/Abscess/Tenosynovitis R Hand    P: Will proceed with I&D in OR this am

## 2011-06-09 NOTE — Op Note (Signed)
06/08/2011 - 06/09/2011  10:53 AM  PATIENT:  Joseph [name]  46 y.o. male  PRE-OPERATIVE DIAGNOSIS:  Infected Right Hand  POST-OPERATIVE DIAGNOSIS:  Infected Right Hand  PROCEDURE:  Procedure(s): IRRIGATION AND DEBRIDEMENT EXTREMITY, Drainage of deep abscess, drainage of extensor tendon sheath  SURGEON:  Surgeon(s): Johnette Abraham, MD  ANESTHESIA:   general  SPECIMEN:  No Specimen  FINDINGS:  Deep infection to bone of right 5th metacarpal  DISPOSITION OF SPECIMEN:  n/a  PATIENT DISPOSITION:  PACU - hemodynamically stable.

## 2011-06-09 NOTE — Preoperative (Signed)
Beta Blockers   Reason not to administer Beta Blockers:Not Applicable. No home beta blockers 

## 2011-06-09 NOTE — Progress Notes (Signed)
Utilization Review Completed.Joseph [name] T1/17/2013   

## 2011-06-09 NOTE — Progress Notes (Signed)
Joseph [name]  EAV:[id number]  DOB: Apr 05, 1966  DOA: 06/08/2011  PCP: No primary provider on file.  Subjective: Right hand still swollen with pain and erythema, swelling in the fingers improving  Objective: Weight change:   Intake/Output Summary (Last 24 hours) at 06/09/11 0858 Last data filed at 06/09/11 0657  Gross per 24 hour  Intake    300 ml  Output    500 ml  Net   -200 ml   Blood pressure 112/58, pulse 87, temperature 97.7 F (36.5 C), temperature source Oral, resp. rate 20, height 5\' 5"  (1.651 m), weight 65.772 kg (145 lb), SpO2 93.00%.  Physical Exam: General: Alert and awake, oriented x3, not in any acute distress. HEENT: anicteric sclera, pupils reactive to light and accommodation, EOMI CVS: S1-S2 clear, no murmur rubs or gallops Chest: clear to auscultation bilaterally, no wheezing, rales or rhonchi Abdomen: soft nontender, nondistended, normal bowel sounds, no organomegaly Extremities: no cyanosis, clubbing or edema noted bilaterally lower extremities. Right hand area with significant swelling, erythema extending into the dorsal aspect of the wrist with decreased range of motion. Fingers swelling is decreasing per the patient Neuro: Cranial nerves II-XII intact, no focal neurological deficits  Lab Results: Basic Metabolic Panel:  Lab 06/09/11 7829 06/08/11 1339  NA 137 141  K 3.8 4.3  CL 103 105  CO2 24 29  GLUCOSE 134* 176*  BUN 14 14  CREATININE 1.21 1.17  CALCIUM 8.7 10.1  MG -- --  PHOS -- --   Liver Function Tests:  Lab 06/09/11 0448  AST 19  ALT 26  ALKPHOS 94  BILITOT 0.7  PROT 6.4  ALBUMIN 3.1*   CBC:  Lab 06/09/11 0448 06/08/11 1339  WBC 8.3 9.7  NEUTROABS -- 7.0  HGB 13.7 15.2  HCT 41.4 44.8  MCV 81.2 80.9  PLT 197 199   Cardiac Enzymes:  Lab 06/08/11 1339  CKTOTAL 73  CKMB --  CKMBINDEX --  TROPONINI --   CBG:  Lab 06/09/11 0815  GLUCAP 107*     Micro Results: Recent Results (from the past 240 hour(s))  WOUND CULTURE      Status: Normal (Preliminary result)   Collection Time   06/08/11 10:11 PM      Component Value Range Status Comment   Specimen Description WOUND HAND   Final    Special Requests NONE   Final    Gram Stain     Final    Value: NO WBC SEEN     NO SQUAMOUS EPITHELIAL CELLS SEEN     FEW GRAM POSITIVE COCCI     IN PAIRS   Culture NO GROWTH 1 DAY   Final    Report Status PENDING   Incomplete   SURGICAL PCR SCREEN     Status: Abnormal   Collection Time   06/09/11  2:28 AM      Component Value Range Status Comment   MRSA, PCR POSITIVE (*) NEGATIVE  Final    Staphylococcus aureus POSITIVE (*) NEGATIVE  Final     Studies/Results: Dg Hand Complete Right  06/08/2011  *RADIOLOGY REPORT*  Clinical Data:  Increased swelling and warmth of right hand, no known injury  RIGHT HAND - COMPLETE 3+ VIEW  Comparison: None  Findings: Significant soft tissue swelling, particularly at dorsal margin of right hand extending to the ulnar border at the level of the distal fifth metacarpal and fifth MCP joint. Osseous mineralization normal for technique. Joint spaces preserved. No acute fracture or dislocation. Flexion deformities  of the DIP joints of the right index and little fingers  Focal area of bony destruction identified at ulnar margin of distal fifth metacarpal. Distal aspect of lesion is 3 mm beyond the articular margin, atypically removed from the joint space for gout. Overall lesion measures approximately 10 x 5 mm in size and shows a questionable tiny Codman triangle at distal margin. No other regional bony abnormalities identified.  IMPRESSION: Flexion deformities of the ring and little fingers, question related to remote tendon injury or current pathology, recommend clinical correlation. Marked soft tissue swelling. Lytic osseous lesion identified at ulnar margin, distal right fifth metacarpal. This could represent a bone tumor, with appearance less typical for osteomyelitis or gout. The soft tissue swelling  may or may not be related to the observed fifth metacarpal bony lesion. Consider MR imaging of the right hand with and without contrast to further evaluate.  Original Report Authenticated By: Lollie Marrow, M.D.    Medications: Scheduled Meds:   . baclofen  10 mg Oral TID  . Chlorhexidine Gluconate Cloth  6 each Topical Q0600  . chlorproMAZINE  100 mg Oral QHS  . citalopram  20 mg Oral Daily  . gabapentin  300 mg Oral QID  . lidocaine      . mirtazapine  15 mg Oral QHS  .  morphine injection  4 mg Intravenous Once  .  morphine injection  4 mg Intravenous Once  .  morphine injection  4 mg Intravenous Once  .  morphine injection  4 mg Intravenous Once  . mupirocin ointment  1 application Nasal BID  . OLANZapine  20 mg Oral BID  . piperacillin-tazobactam (ZOSYN)  IV  3.375 g Intravenous Once  . piperacillin-tazobactam (ZOSYN)  IV  3.375 g Intravenous Q8H  . salsalate  500 mg Oral TID  . vancomycin  750 mg Intravenous Q12H  . vancomycin  1,000 mg Intravenous Once   Continuous Infusions:   . sodium chloride 125 mL/hr at 06/08/11 2227     Assessment/Plan: Principal Problem:  *Cellulitis and abscess of hand: - Continue vancomycin and Zosyn IV per pharmacy - Status post bedside I&D per hand surgery (Dr. Izora Ribas), plan for I&D in the OR today   Lytic osseous lesion at ulnar margin on the distal right fifth metacarpal on x-ray: Obtain MRI for further workup to rule out any bone tumor.  Active Problems:  Depression: Stable   Schizophrenia: Stable  DVT Prophylaxis: SCDs  Code Status: Full code  Disposition: Currently not medically ready   LOS: 1 day   Kalei Mckillop M.D. [facility] 06/09/2011, 8:58 AM

## 2011-06-09 NOTE — Anesthesia Postprocedure Evaluation (Signed)
  Anesthesia Post-op Note  Patient: Joseph [name]  Procedure(s) Performed:  IRRIGATION AND DEBRIDEMENT EXTREMITY  Patient Location: PACU  Anesthesia Type: General  Level of Consciousness: awake, oriented, sedated and patient cooperative  Airway and Oxygen Therapy: Patient Spontanous Breathing and Patient connected to nasal cannula oxygen  Post-op Pain: mild  Post-op Assessment: Post-op Vital signs reviewed, Patient's Cardiovascular Status Stable, Respiratory Function Stable, Patent Airway, No signs of Nausea or vomiting and Pain level controlled  Post-op Vital Signs: stable  Complications: No apparent anesthesia complications

## 2011-06-09 NOTE — Transfer of Care (Signed)
Immediate Anesthesia Transfer of Care Note  Patient: Joseph [name]  Procedure(s) Performed:  IRRIGATION AND DEBRIDEMENT EXTREMITY  Patient Location: PACU  Anesthesia Type: General  Level of Consciousness: sedated  Airway & Oxygen Therapy: Patient Spontanous Breathing and Patient connected to nasal cannula oxygen  Post-op Assessment: Report given to PACU RN and Post -op Vital signs reviewed and stable  Post vital signs: Reviewed Filed Vitals:   06/09/11 0843  BP: 112/58  Pulse: 87  Temp: 36.5 C  Resp: 20    Complications: No apparent anesthesia complications

## 2011-06-09 NOTE — Anesthesia Procedure Notes (Signed)
Procedure Name: LMA Insertion Date/Time: 06/09/2011 10:23 AM Performed by: Margaree Mackintosh Pre-anesthesia Checklist: Patient identified, Emergency Drugs available, Suction available, Patient being monitored and Timeout performed Patient Re-evaluated:Patient Re-evaluated prior to inductionOxygen Delivery Method: Circle System Utilized Preoxygenation: Pre-oxygenation with 100% oxygen Intubation Type: IV induction Ventilation: Mask ventilation with difficulty LMA: LMA inserted LMA Size: 4.0 Number of attempts: 1 Placement Confirmation: positive ETCO2 and breath sounds checked- equal and bilateral Tube secured with: Tape

## 2011-06-10 LAB — CBC
Hemoglobin: 12.5 g/dL — ABNORMAL LOW (ref 13.0–17.0)
MCHC: 32.6 g/dL (ref 30.0–36.0)
WBC: 5.6 10*3/uL (ref 4.0–10.5)

## 2011-06-10 LAB — GLUCOSE, CAPILLARY: Glucose-Capillary: 109 mg/dL — ABNORMAL HIGH (ref 70–99)

## 2011-06-10 LAB — BASIC METABOLIC PANEL
Calcium: 8.7 mg/dL (ref 8.4–10.5)
Glucose, Bld: 180 mg/dL — ABNORMAL HIGH (ref 70–99)
Potassium: 3.9 mEq/L (ref 3.5–5.1)
Sodium: 140 mEq/L (ref 135–145)

## 2011-06-10 NOTE — Progress Notes (Signed)
ANTIBIOTIC CONSULT NOTE - FOLLOW UP  Pharmacy Consult for vancomycin/zosyn Indication: right hand abscess/osteomyelitis s/p I&D   Assessment: 46 yo M who presented to the [facility] ED for R hand pain and swelling for at least 5 days. Now s/p I&D yesterday, per post-op report found deep infection to bone of right 5th metacarpal. WBC down to 5.6, no fevers noted, hand wound-few GPC, renal function has been stable. Day 3 vancomycin, with stable renal function no changes to regimen at this time. Will plan on trough next 1-2 days if therapy is to continue.   Goal of Therapy:  Vancomycin trough level 15-20 mcg/ml (osteo)  Plan:  Continue vancomyicn 750 q 12 hours Continue zosyn 3.375 q 8 hours F/u hand wound culture   No Known Allergies  Patient Measurements: Height: 5\' 5"  (165.1 cm) Weight: 145 lb (65.772 kg) IBW/kg (Calculated) : 61.5    Vital Signs: Temp: 98.2 F (36.8 C) (01/18 0610) Temp src: Oral (01/17 2142) BP: 148/86 mmHg (01/18 0610) Pulse Rate: 83  (01/18 0610) Intake/Output from previous day: 01/17 0701 - 01/18 0700 In: 2107.3 [P.O.:360; I.V.:1347.3; IV Piggyback:400] Out: 400 [Urine:400] Intake/Output from this shift:    Labs:  Basename 06/10/11 0545 06/09/11 0448 06/08/11 1339  WBC 5.6 8.3 9.7  HGB 12.5* 13.7 15.2  PLT 185 197 199  LABCREA -- -- --  CREATININE 1.17 1.21 1.17   Estimated Creatinine Clearance: 67.9 ml/min (by C-G formula based on Cr of 1.17). No results found for this basename: VANCOTROUGH:2,VANCOPEAK:2,VANCORANDOM:2,GENTTROUGH:2,GENTPEAK:2,GENTRANDOM:2,TOBRATROUGH:2,TOBRAPEAK:2,TOBRARND:2,AMIKACINPEAK:2,AMIKACINTROU:2,AMIKACIN:2, in the last 72 hours   Microbiology: Recent Results (from the past 720 hour(s))  WOUND CULTURE     Status: Normal (Preliminary result)   Collection Time   06/08/11 10:11 PM      Component Value Range Status Comment   Specimen Description WOUND HAND   Final    Special Requests NONE   Final    Gram Stain     Final      Value: NO WBC SEEN     NO SQUAMOUS EPITHELIAL CELLS SEEN     FEW GRAM POSITIVE COCCI     IN PAIRS   Culture NO GROWTH 1 DAY   Final    Report Status PENDING   Incomplete   SURGICAL PCR SCREEN     Status: Abnormal   Collection Time   06/09/11  2:28 AM      Component Value Range Status Comment   MRSA, PCR POSITIVE (*) NEGATIVE  Final    Staphylococcus aureus POSITIVE (*) NEGATIVE  Final     Anti-infectives     Start     Dose/Rate Route Frequency Ordered Stop   06/09/11 0200   vancomycin (VANCOCIN) 750 mg in sodium chloride 0.9 % 150 mL IVPB        750 mg 150 mL/hr over 60 Minutes Intravenous Every 12 hours 06/08/11 2215     06/08/11 2300  piperacillin-tazobactam (ZOSYN) IVPB 3.375 g       3.375 g 12.5 mL/hr over 240 Minutes Intravenous 3 times per day 06/08/11 2215     06/08/11 1500  piperacillin-tazobactam (ZOSYN) IVPB 3.375 g       3.375 g 12.5 mL/hr over 240 Minutes Intravenous  Once 06/08/11 1457 06/08/11 1909   06/08/11 1345   vancomycin (VANCOCIN) IVPB 1000 mg/200 mL premix        1,000 mg 200 mL/hr over 60 Minutes Intravenous  Once 06/08/11 1339 06/08/11 1453          Joseph [name], Joseph [name]  06/10/2011,8:29 AM

## 2011-06-10 NOTE — Op Note (Signed)
[name], Joseph                 ACCOUNT NO.:  [id number]  MEDICAL RECORD NO.:  [id number]  LOCATION:  5151                         FACILITY:  [facility]  PHYSICIAN:  Johnette Abraham, MD    DATE OF BIRTH:  [birth year]  DATE OF PROCEDURE:  06/09/2011 DATE OF DISCHARGE:                              OPERATIVE REPORT   PREOPERATIVE DIAGNOSIS:  Infection of the right hand deep abscess and osteomyelitis methicillin-resistant Staphylococcus aureus.  POSTOPERATIVE DIAGNOSIS:  Infection of the right hand deep abscess and osteomyelitis methicillin-resistant Staphylococcus aureus.  PROCEDURE:  Incision and drainage of deep abscess, irrigation of the extensor tendon sheath with debridement of bone.  ANESTHESIA:  General.  COMPLICATIONS:  No acute complications.  BLOOD LOSS:  Minimal.  INDICATIONS:  [name] presented to [facility] on transfer from [facility] with signs and symptoms of infection of the right hand.  This was I and D'ed last night, there was a significant amount of purulence which was subsequently cultured MRSA.  His hand is still significantly swollen and requires additional formal incision and drainage.  Consent was obtained.  PROCEDURE:  The patient was taken to the operating room, placed supine on the operating table.  General anesthesia was administered without difficulty.  The arm was elevated, tourniquet was inflated to 250 mmHg. An incision along the ulnar border of the right hand was performed for about 4 cm.  There was a deep pocket directly over the fifth metacarpal head.  There was purulence.  The bone was exposed here.  There was actually some encroachment of the cortex due to the infection, this was curetted out.  The extensor tendon sheath was then opened and thoroughly irrigated with a liter of irrigation solution.  There did not appear to be any other tracking deep infection besides what was previously mentioned.  Afterwards, the tourniquet was released.   Hemostasis was obtained.  The wound was partially closed with interrupted 4-0.  The deep pocket and area adjacent to the bone was packed with quarter-inch iodoform gauze.  Sterile dressing was applied.  The patient tolerated the procedure well, was taken to recovery room in stable condition.     Johnette Abraham, MD     HCC/MEDQ  D:  06/09/2011  T:  06/10/2011  Job:  [id number]

## 2011-06-10 NOTE — Progress Notes (Signed)
S:some hand pain, offers no other complaints  O:Blood pressure 148/86, pulse 83, temperature 98.2 F (36.8 C), temperature source Oral, resp. rate 18, height 5\' 5"  (1.651 m), weight 65.772 kg (145 lb), SpO2 92.00%. Results for orders placed during the hospital encounter of 06/08/11  WOUND CULTURE     Status: Normal (Preliminary result)   Collection Time   06/08/11 10:11 PM      Component Value Range Status Comment   Specimen Description WOUND HAND   Final    Special Requests NONE   Final    Gram Stain     Final    Value: NO WBC SEEN     NO SQUAMOUS EPITHELIAL CELLS SEEN     FEW GRAM POSITIVE COCCI     IN PAIRS   Culture NO GROWTH 1 DAY   Final    Report Status PENDING   Incomplete   SURGICAL PCR SCREEN     Status: Abnormal   Collection Time   06/09/11  2:28 AM      Component Value Range Status Comment   MRSA, PCR POSITIVE (*) NEGATIVE  Final    Staphylococcus aureus POSITIVE (*) NEGATIVE  Final     R Hand:   Dressing changed, packing removed, still some swelling and erythema, no pus from wound  A:s/p ID deep abscess, tenosynovitis R hand    P:follow cultures, prob MRSA, cont antibiotics for now, start dressing changes

## 2011-06-10 NOTE — Progress Notes (Signed)
Covey Baller  YNW:[id number]  DOB: Feb 03, 1966  DOA: 06/08/2011  PCP: No primary provider on file.  Subjective: Right hand pain, improving  Objective: Weight change:   Intake/Output Summary (Last 24 hours) at 06/10/11 1144 Last data filed at 06/10/11 0622  Gross per 24 hour  Intake 1107.33 ml  Output      0 ml  Net 1107.33 ml   Blood pressure 160/94, pulse 87, temperature 97.7 F (36.5 C), temperature source Oral, resp. rate 18, height 5\' 5"  (1.651 m), weight 65.772 kg (145 lb), SpO2 99.00%.  Physical Exam: General: Alert and awake, oriented x3, not in any acute distress. HEENT: anicteric sclera, pupils reactive to light and accommodation, EOMI CVS: S1-S2 clear, no murmur rubs or gallops Chest: clear to auscultation bilaterally, no wheezing, rales or rhonchi Abdomen: soft nontender, nondistended, normal bowel sounds, no organomegaly Extremities: no cyanosis, clubbing or edema noted bilaterally lower extremities. Right hand area dressing intact  Neuro: Cranial nerves II-XII intact, no focal neurological deficits  Lab Results: Basic Metabolic Panel:  Lab 06/10/11 6578 06/09/11 0448  NA 140 137  K 3.9 3.8  CL 105 103  CO2 24 24  GLUCOSE 180* 134*  BUN 10 14  CREATININE 1.17 1.21  CALCIUM 8.7 8.7  MG -- --  PHOS -- --   Liver Function Tests:  Lab 06/09/11 0448  AST 19  ALT 26  ALKPHOS 94  BILITOT 0.7  PROT 6.4  ALBUMIN 3.1*   CBC:  Lab 06/10/11 0545 06/09/11 0448 06/08/11 1339  WBC 5.6 8.3 --  NEUTROABS -- -- 7.0  HGB 12.5* 13.7 --  HCT 38.3* 41.4 --  MCV 81.7 81.2 --  PLT 185 197 --   Cardiac Enzymes:  Lab 06/08/11 1339  CKTOTAL 73  CKMB --  CKMBINDEX --  TROPONINI --   CBG:  Lab 06/10/11 0745 06/09/11 0815  GLUCAP 109* 107*     Micro Results: Recent Results (from the past 240 hour(s))  WOUND CULTURE     Status: Normal (Preliminary result)   Collection Time   06/08/11 10:11 PM      Component Value Range Status Comment   Specimen Description  WOUND HAND   Final    Special Requests NONE   Final    Gram Stain     Final    Value: NO WBC SEEN     NO SQUAMOUS EPITHELIAL CELLS SEEN     FEW GRAM POSITIVE COCCI     IN PAIRS   Culture     Final    Value: ABUNDANT STAPHYLOCOCCUS AUREUS     Note: RIFAMPIN AND GENTAMICIN SHOULD NOT BE USED AS SINGLE DRUGS FOR TREATMENT OF STAPH INFECTIONS.   Report Status PENDING   Incomplete   SURGICAL PCR SCREEN     Status: Abnormal   Collection Time   06/09/11  2:28 AM      Component Value Range Status Comment   MRSA, PCR POSITIVE (*) NEGATIVE  Final    Staphylococcus aureus POSITIVE (*) NEGATIVE  Final     Studies/Results: Dg Hand Complete Right  06/08/2011  *RADIOLOGY REPORT*  Clinical Data:  Increased swelling and warmth of right hand, no known injury  RIGHT HAND - COMPLETE 3+ VIEW  Comparison: None  Findings: Significant soft tissue swelling, particularly at dorsal margin of right hand extending to the ulnar border at the level of the distal fifth metacarpal and fifth MCP joint. Osseous mineralization normal for technique. Joint spaces preserved. No acute fracture or dislocation.  Flexion deformities of the DIP joints of the right index and little fingers  Focal area of bony destruction identified at ulnar margin of distal fifth metacarpal. Distal aspect of lesion is 3 mm beyond the articular margin, atypically removed from the joint space for gout. Overall lesion measures approximately 10 x 5 mm in size and shows a questionable tiny Codman triangle at distal margin. No other regional bony abnormalities identified.  IMPRESSION: Flexion deformities of the ring and little fingers, question related to remote tendon injury or current pathology, recommend clinical correlation. Marked soft tissue swelling. Lytic osseous lesion identified at ulnar margin, distal right fifth metacarpal. This could represent a bone tumor, with appearance less typical for osteomyelitis or gout. The soft tissue swelling may or may not  be related to the observed fifth metacarpal bony lesion. Consider MR imaging of the right hand with and without contrast to further evaluate.  Original Report Authenticated By: Lollie Marrow, M.D.    Medications: Scheduled Meds:    . baclofen  10 mg Oral TID  . Chlorhexidine Gluconate Cloth  6 each Topical Q0600  . chlorproMAZINE  100 mg Oral QHS  . citalopram  20 mg Oral Daily  . gabapentin  300 mg Oral QID  . mirtazapine  15 mg Oral QHS  . mupirocin ointment  1 application Nasal BID  . OLANZapine  20 mg Oral BID  . piperacillin-tazobactam (ZOSYN)  IV  3.375 g Intravenous Q8H  . salsalate  500 mg Oral TID  . vancomycin  750 mg Intravenous Q12H   Continuous Infusions:    . sodium chloride 20 mL/hr at 06/09/11 1053     Assessment/Plan: Principal Problem:  *Cellulitis and abscess of hand: - Cultures growing staph aureus, await sensitivities - Continue vancomycin and Zosyn IV per pharmacy - Status post I&D per hand surgery (Dr. Izora Ribas) x2, follow recommendations   Lytic osseous lesion at ulnar margin on the distal right fifth metacarpal on x-ray: MRI canceled by hand surgery  Active Problems:  Depression: Stable   Schizophrenia: Stable  DVT Prophylaxis: SCDs  Code Status: Full code  Disposition: Currently not medically ready   LOS: 2 days   Alta Goding M.D. [facility] 06/10/2011, 11:44 AM

## 2011-06-10 NOTE — Progress Notes (Signed)
Late entry for 06/09/2011  CSW attempted to assess pt for advanced directives but patient was in surgery. CSW unable to assess, will follow up at a later time.   Catha Gosselin, LCSWA  [phone]  06/09/2011 1200pm

## 2011-06-11 DIAGNOSIS — L089 Local infection of the skin and subcutaneous tissue, unspecified: Secondary | ICD-10-CM

## 2011-06-11 DIAGNOSIS — A4902 Methicillin resistant Staphylococcus aureus infection, unspecified site: Secondary | ICD-10-CM

## 2011-06-11 DIAGNOSIS — M869 Osteomyelitis, unspecified: Secondary | ICD-10-CM

## 2011-06-11 LAB — CBC
Hemoglobin: 14 g/dL (ref 13.0–17.0)
MCH: 27 pg (ref 26.0–34.0)
MCHC: 33.4 g/dL (ref 30.0–36.0)
MCV: 80.7 fL (ref 78.0–100.0)
RBC: 5.19 MIL/uL (ref 4.22–5.81)

## 2011-06-11 LAB — BASIC METABOLIC PANEL
BUN: 10 mg/dL (ref 6–23)
CO2: 22 mEq/L (ref 19–32)
Calcium: 9.3 mg/dL (ref 8.4–10.5)
Creatinine, Ser: 1.06 mg/dL (ref 0.50–1.35)
GFR calc non Af Amer: 82 mL/min — ABNORMAL LOW (ref >90)
Glucose, Bld: 109 mg/dL — ABNORMAL HIGH (ref 70–99)
Sodium: 140 mEq/L (ref 135–145)

## 2011-06-11 MED ORDER — HYDROMORPHONE HCL PF 1 MG/ML IJ SOLN
0.5000 mg | INTRAMUSCULAR | Status: DC | PRN
  Administered 2011-06-11: 1 mg via INTRAVENOUS
  Filled 2011-06-11: qty 1

## 2011-06-11 MED ORDER — OXYCODONE HCL 5 MG PO TABS
5.0000 mg | ORAL_TABLET | ORAL | Status: DC | PRN
  Administered 2011-06-11 – 2011-06-12 (×2): 10 mg via ORAL
  Filled 2011-06-11 (×2): qty 2

## 2011-06-11 MED ORDER — LORAZEPAM 2 MG/ML IJ SOLN: 1.0000 mg | Freq: Four times a day (QID) | INTRAMUSCULAR | Status: DC | PRN

## 2011-06-11 MED ORDER — ACETAMINOPHEN 500 MG PO TABS
500.0000 mg | ORAL_TABLET | Freq: Three times a day (TID) | ORAL | Status: DC
  Administered 2011-06-11 – 2011-06-12 (×3): 500 mg via ORAL
  Filled 2011-06-11 (×6): qty 1

## 2011-06-11 MED ORDER — HYDROMORPHONE HCL PF 1 MG/ML IJ SOLN: 1.0000 mg | INTRAMUSCULAR | Status: DC | PRN

## 2011-06-11 MED ORDER — LORAZEPAM 1 MG PO TABS
1.0000 mg | ORAL_TABLET | Freq: Four times a day (QID) | ORAL | Status: DC | PRN
  Administered 2011-06-11: 1 mg via ORAL
  Filled 2011-06-11: qty 1

## 2011-06-11 NOTE — Progress Notes (Signed)
06/11/11 1357  ondansetron (ZOFRAN) injection 4 mg  Volume (mL) 211    0

## 2011-06-11 NOTE — Consult Note (Signed)
Date of Admission:  06/08/2011  Date of Consult:  06/11/2011  Reason for Consult: MRSA hand abscess, tenosynovitis Referring Physician: Dr. Isidoro Donning   HPI: Joseph [name] is an 46 y.o. male with a past medical history of schizophrenia IVDU prior incarceration, Hepatitis B , who presented [facility] hospital emergency room ear[place] in the week with complaints of right hand pain and swelling. He had  erythema and pain of his right hand for at least 5 days  prior but upon waking up today he notes that his hand was significantly more swollen and much more painful> He also had noticed two boils between scrotum and right thigh. He was admitted to [facility] placed on vancomycin and zosyn and now has undergone I and D of deep abscess, irrigation of the extensor tendon sheath with debridement of bone by Dr. Izora Ribas on April 17th. Intraoperative cultures are shoiwing MRSA and abx have been simplifeid to vancomycin alone.    Past Medical History  Diagnosis Date  . Schizophrenia   . Depression   . Anxiety   . Nerve damage   . Asthma   . COPD (chronic obstructive pulmonary disease)   . Pneumonia   . Chronic bronchitis   . Hepatitis 1990's    "B"  . Schizophrenia     Past Surgical History  Procedure Date  . I&d extremity 06/08/11    right hand  . Tendon repair 1980's    right hand  . Tonsillectomy     "when I was a kid"  . Incision and drainage of wound 1980's    left knee S/P "cut it w/a chainsaw"  . Appendectomy     "when I was young"  ergies:   No Known Allergies   Medications: I have reviewed patients current medications as documented in Epic Anti-infectives     Start     Dose/Rate Route Frequency Ordered Stop   06/09/11 0200   vancomycin (VANCOCIN) 750 mg in sodium chloride 0.9 % 150 mL IVPB        750 mg 150 mL/hr over 60 Minutes Intravenous Every 12 hours 06/08/11 2215     06/08/11 2300   piperacillin-tazobactam (ZOSYN) IVPB 3.375 g  Status:  Discontinued        3.375 g 12.5 mL/hr over  240 Minutes Intravenous 3 times per day 06/08/11 2215 06/11/11 1404   06/08/11 1500  piperacillin-tazobactam (ZOSYN) IVPB 3.375 g       3.375 g 12.5 mL/hr over 240 Minutes Intravenous  Once 06/08/11 1457 06/08/11 1909   06/08/11 1345   vancomycin (VANCOCIN) IVPB 1000 mg/200 mL premix        1,000 mg 200 mL/hr over 60 Minutes Intravenous  Once 06/08/11 1339 06/08/11 1453          Social History:  reports that he has been smoking Cigarettes.  He has a 55.5 pack-year smoking history. He has never used smokeless tobacco. He reports that he drinks about .6 ounces of alcohol per week. He reports that he does not use illicit drugs.  History reviewed. No pertinent family history.  As in HPI and primary teams notes otherwise 12 point review of systems is negative  Blood pressure 137/85, pulse 75, temperature 98.1 F (36.7 C), temperature source Oral, resp. rate 12, height 5\' 5"  (1.651 m), weight 145 lb (65.772 kg), SpO2 95.00%. General: Alert and awake, oriented x3, not in any acute distress. HEENT: anicteric sclera, pupils reactive to light and accommodation, EOMI, oropharynx clear and without exudate CVS  regular rate, normal r,  no murmur rubs or gallops Chest: clear to auscultation bilaterally, no wheezing, rales or rhonchi Abdomen: soft nontender, nondistended, normal bowel sounds, Extremities: right hand with bandage,  Skin: no rashes Neuro: nonfocal, strength and sensation intact   Results for orders placed during the hospital encounter of 06/08/11 (from the past 48 hour(s))  CBC     Status: Abnormal   Collection Time   06/10/11  5:45 AM      Component Value Range Comment   WBC 5.6  4.0 - 10.5 (K/uL)    RBC 4.69  4.22 - 5.81 (MIL/uL)    Hemoglobin 12.5 (*) 13.0 - 17.0 (g/dL)    HCT 16.1 (*) 09.6 - 52.0 (%)    MCV 81.7  78.0 - 100.0 (fL)    MCH 26.7  26.0 - 34.0 (pg)    MCHC 32.6  30.0 - 36.0 (g/dL)    RDW 04.5  40.9 - 81.1 (%)    Platelets 185  150 - 400 (K/uL)   BASIC  METABOLIC PANEL     Status: Abnormal   Collection Time   06/10/11  5:45 AM      Component Value Range Comment   Sodium 140  135 - 145 (mEq/L)    Potassium 3.9  3.5 - 5.1 (mEq/L)    Chloride 105  96 - 112 (mEq/L)    CO2 24  19 - 32 (mEq/L)    Glucose, Bld 180 (*) 70 - 99 (mg/dL)    BUN 10  6 - 23 (mg/dL)    Creatinine, Ser 9.14  0.50 - 1.35 (mg/dL)    Calcium 8.7  8.4 - 10.5 (mg/dL)    GFR calc non Af Amer 73 (*) >90 (mL/min)    GFR calc Af Amer 84 (*) >90 (mL/min)   GLUCOSE, CAPILLARY     Status: Abnormal   Collection Time   06/10/11  7:45 AM      Component Value Range Comment   Glucose-Capillary 109 (*) 70 - 99 (mg/dL)    Comment 1 Notify RN     CBC     Status: Normal   Collection Time   06/11/11  6:45 AM      Component Value Range Comment   WBC 6.7  4.0 - 10.5 (K/uL)    RBC 5.19  4.22 - 5.81 (MIL/uL)    Hemoglobin 14.0  13.0 - 17.0 (g/dL)    HCT 78.2  95.6 - 21.3 (%)    MCV 80.7  78.0 - 100.0 (fL)    MCH 27.0  26.0 - 34.0 (pg)    MCHC 33.4  30.0 - 36.0 (g/dL)    RDW 08.6  57.8 - 46.9 (%)    Platelets 220  150 - 400 (K/uL)   BASIC METABOLIC PANEL     Status: Abnormal   Collection Time   06/11/11  6:45 AM      Component Value Range Comment   Sodium 140  135 - 145 (mEq/L)    Potassium 4.1  3.5 - 5.1 (mEq/L)    Chloride 104  96 - 112 (mEq/L)    CO2 22  19 - 32 (mEq/L)    Glucose, Bld 109 (*) 70 - 99 (mg/dL)    BUN 10  6 - 23 (mg/dL)    Creatinine, Ser 6.29  0.50 - 1.35 (mg/dL)    Calcium 9.3  8.4 - 10.5 (mg/dL)    GFR calc non Af Amer 82 (*) >90 (mL/min)  GFR calc Af Amer >90  >90 (mL/min)   GLUCOSE, CAPILLARY     Status: Abnormal   Collection Time   06/11/11  7:33 AM      Component Value Range Comment   Glucose-Capillary 123 (*) 70 - 99 (mg/dL)    Comment 1 Notify RN         Component Value Date/Time   SDES WOUND HAND 06/08/2011 2211   SPECREQUEST NONE 06/08/2011 2211   CULT  Value: ABUNDANT METHICILLIN RESISTANT STAPHYLOCOCCUS AUREUS Note: RIFAMPIN AND GENTAMICIN  SHOULD NOT BE USED AS SINGLE DRUGS FOR TREATMENT OF STAPH INFECTIONS. CRITICAL RESULT CALLED TO, READ BACK BY AND VERIFIED WITH: JILL MOORE 06/11/11 0945 BY [email or web address] 06/08/2011 2211   REPTSTATUS 06/11/2011 FINAL 06/08/2011 2211   No results found.   Recent Results (from the past 720 hour(s))  WOUND CULTURE     Status: Normal   Collection Time   06/08/11 10:11 PM      Component Value Range Status Comment   Specimen Description WOUND HAND   Final    Special Requests NONE   Final    Gram Stain     Final    Value: NO WBC SEEN     NO SQUAMOUS EPITHELIAL CELLS SEEN     FEW GRAM POSITIVE COCCI     IN PAIRS   Culture     Final    Value: ABUNDANT METHICILLIN RESISTANT STAPHYLOCOCCUS AUREUS     Note: RIFAMPIN AND GENTAMICIN SHOULD NOT BE USED AS SINGLE DRUGS FOR TREATMENT OF STAPH INFECTIONS. CRITICAL RESULT CALLED TO, READ BACK BY AND VERIFIED WITH: JILL MOORE 06/11/11 0945 BY [email or web address]   Report Status 06/11/2011 FINAL   Final    Organism ID, Bacteria METHICILLIN RESISTANT STAPHYLOCOCCUS AUREUS   Final   SURGICAL PCR SCREEN     Status: Abnormal   Collection Time   06/09/11  2:28 AM      Component Value Range Status Comment   MRSA, PCR POSITIVE (*) NEGATIVE  Final    Staphylococcus aureus POSITIVE (*) NEGATIVE  Final      Impression/Recommendation 46  Year old with Hep B, prior IVDU now with hand abscess tenosynovitis and osteomyelitis with MRSA  1) Hand infection: --Ideally would want to treat him with 6 to 8 weeks of parenteral abx but given his IVDU history would likely decide to treate him with a po regimen, either high dose bactrim or doxycycline --will check esr and crp --He will need VERY close fu by Hand surgery and by [place] in [facility] prior to stopping abx  2) Hepatitis B: --resend hepatitis panel hep b dna and hiv ab  3) IP: Contact precatuiosn for MRSA   Thank you so much for this interesting consult,   Acey Lav 06/11/2011, 2:30 PM   [phone] (pager) [phone]  (office)

## 2011-06-11 NOTE — Progress Notes (Signed)
S:hand feels better, dressing being changed tid, packing with iodoform gauze and irrigating wound.  O:Blood pressure 137/85, pulse 75, temperature 98.1 F (36.7 C), temperature source Oral, resp. rate 12, height 5\' 5"  (1.651 m), weight 65.772 kg (145 lb), SpO2 95.00%. Results for orders placed during the hospital encounter of 06/08/11  WOUND CULTURE     Status: Normal   Collection Time   06/08/11 10:11 PM      Component Value Range Status Comment   Specimen Description WOUND HAND   Final    Special Requests NONE   Final    Gram Stain     Final    Value: NO WBC SEEN     NO SQUAMOUS EPITHELIAL CELLS SEEN     FEW GRAM POSITIVE COCCI     IN PAIRS   Culture     Final    Value: ABUNDANT METHICILLIN RESISTANT STAPHYLOCOCCUS AUREUS     Note: RIFAMPIN AND GENTAMICIN SHOULD NOT BE USED AS SINGLE DRUGS FOR TREATMENT OF STAPH INFECTIONS. CRITICAL RESULT CALLED TO, READ BACK BY AND VERIFIED WITH: JILL MOORE 06/11/11 0945 BY [email or web address]   Report Status 06/11/2011 FINAL   Final    Organism ID, Bacteria METHICILLIN RESISTANT STAPHYLOCOCCUS AUREUS   Final   SURGICAL PCR SCREEN     Status: Abnormal   Collection Time   06/09/11  2:28 AM      Component Value Range Status Comment   MRSA, PCR POSITIVE (*) NEGATIVE  Final    Staphylococcus aureus POSITIVE (*) NEGATIVE  Final    R hand: decreased erythema and swelling, wound still open without pus  A:s/p I&D r hand - MRSA   P: cont wound care, packing, irrigation; would cont Vanc for now IV, then transition to oral antibiotics (?Doxycycline), ok for d/c tomorrow (Sunday).

## 2011-06-11 NOTE — Progress Notes (Signed)
Joseph [name]  ZOX:[id number]  DOB: Jun 05, 1965  DOA: 06/08/2011  PCP: No primary provider on file.  Subjective: Right hand swelling improving. Patient noticed to be in and out of periods of confusion, states he he drinks beer off and on but not heavy. He stated that he drank beer before the admission. Also per RN, patient too drowsy after Dilaudid dose.  Objective: Weight change:   Intake/Output Summary (Last 24 hours) at 06/11/11 0908 Last data filed at 06/11/11 0659  Gross per 24 hour  Intake   1550 ml  Output    425 ml  Net   1125 ml   Blood pressure 137/85, pulse 75, temperature 98.1 F (36.7 C), temperature source Oral, resp. rate 12, height 5\' 5"  (1.651 m), weight 65.772 kg (145 lb), SpO2 95.00%.  Physical Exam: General: Alert and awake, oriented x3, not in any acute distress on my exam. HEENT: anicteric sclera, pupils reactive to light and accommodation, EOMI CVS: S1-S2 clear, no murmur rubs or gallops Chest: clear to auscultation bilaterally, no wheezing, rales or rhonchi Abdomen: soft nontender, nondistended, normal bowel sounds, no organomegaly Extremities: Right hand erythema, swelling decreased, significantly improved on the fingers. no cyanosis, clubbing or edema noted bilaterally lower extremities.   Lab Results: Basic Metabolic Panel:  Lab 06/11/11 8119 06/10/11 0545  NA 140 140  K 4.1 3.9  CL 104 105  CO2 22 24  GLUCOSE 109* 180*  BUN 10 10  CREATININE 1.06 1.17  CALCIUM 9.3 8.7  MG -- --  PHOS -- --   Liver Function Tests:  Lab 06/09/11 0448  AST 19  ALT 26  ALKPHOS 94  BILITOT 0.7  PROT 6.4  ALBUMIN 3.1*   CBC:  Lab 06/11/11 0645 06/10/11 0545 06/08/11 1339  WBC 6.7 5.6 --  NEUTROABS -- -- 7.0  HGB 14.0 12.5* --  HCT 41.9 38.3* --  MCV 80.7 81.7 --  PLT 220 185 --   Cardiac Enzymes:  Lab 06/08/11 1339  CKTOTAL 73  CKMB --  CKMBINDEX --  TROPONINI --   CBG:  Lab 06/10/11 0745 06/09/11 0815  GLUCAP 109* 107*     Micro  Results: Recent Results (from the past 240 hour(s))  WOUND CULTURE     Status: Normal (Preliminary result)   Collection Time   06/08/11 10:11 PM      Component Value Range Status Comment   Specimen Description WOUND HAND   Final    Special Requests NONE   Final    Gram Stain     Final    Value: NO WBC SEEN     NO SQUAMOUS EPITHELIAL CELLS SEEN     FEW GRAM POSITIVE COCCI     IN PAIRS   Culture     Final    Value: ABUNDANT STAPHYLOCOCCUS AUREUS     Note: RIFAMPIN AND GENTAMICIN SHOULD NOT BE USED AS SINGLE DRUGS FOR TREATMENT OF STAPH INFECTIONS.   Report Status PENDING   Incomplete   SURGICAL PCR SCREEN     Status: Abnormal   Collection Time   06/09/11  2:28 AM      Component Value Range Status Comment   MRSA, PCR POSITIVE (*) NEGATIVE  Final    Staphylococcus aureus POSITIVE (*) NEGATIVE  Final     Studies/Results: Dg Hand Complete Right  06/08/2011  *RADIOLOGY REPORT*  Clinical Data:  Increased swelling and warmth of right hand, no known injury  RIGHT HAND - COMPLETE 3+ VIEW  Comparison: None  Findings:  Significant soft tissue swelling, particularly at dorsal margin of right hand extending to the ulnar border at the level of the distal fifth metacarpal and fifth MCP joint. Osseous mineralization normal for technique. Joint spaces preserved. No acute fracture or dislocation. Flexion deformities of the DIP joints of the right index and little fingers  Focal area of bony destruction identified at ulnar margin of distal fifth metacarpal. Distal aspect of lesion is 3 mm beyond the articular margin, atypically removed from the joint space for gout. Overall lesion measures approximately 10 x 5 mm in size and shows a questionable tiny Codman triangle at distal margin. No other regional bony abnormalities identified.  IMPRESSION: Flexion deformities of the ring and little fingers, question related to remote tendon injury or current pathology, recommend clinical correlation. Marked soft tissue  swelling. Lytic osseous lesion identified at ulnar margin, distal right fifth metacarpal. This could represent a bone tumor, with appearance less typical for osteomyelitis or gout. The soft tissue swelling may or may not be related to the observed fifth metacarpal bony lesion. Consider MR imaging of the right hand with and without contrast to further evaluate.  Original Report Authenticated By: Lollie Marrow, M.D.    Medications: Scheduled Meds:    . baclofen  10 mg Oral TID  . Chlorhexidine Gluconate Cloth  6 each Topical Q0600  . chlorproMAZINE  100 mg Oral QHS  . citalopram  20 mg Oral Daily  . gabapentin  300 mg Oral QID  . mirtazapine  15 mg Oral QHS  . mupirocin ointment  1 application Nasal BID  . OLANZapine  20 mg Oral BID  . piperacillin-tazobactam (ZOSYN)  IV  3.375 g Intravenous Q8H  . salsalate  500 mg Oral TID  . vancomycin  750 mg Intravenous Q12H   Continuous Infusions:    . sodium chloride 20 mL/hr at 06/09/11 1053     Assessment/Plan: Principal Problem:  *Cellulitis and abscess of hand with possible osteomyelitis to the fifth metacarpal: - Cultures growing staph aureus, await sensitivities - Continue vancomycin IV per pharmacy, cultures growing abundant staph aureus, DC Zosyn for now - Status post I&D per hand surgery (Dr. Izora Ribas) x2, follow recommendations. Also discussed with ID (Dr. Algis Liming), patient may require IV antibiotics/vancomycin for 6-8 weeks especially with osteomyelitis and possible MRSA infection.  History of alcohol use with possible alcohol withdrawals/periods of confusion: - Monitor closely with CIWA assessment and when necessary Ativan.  - Also decreased Dilaudid, placed on scheduled Tylenol and continue oxycodone.  Active Problems:  Depression: Stable   Schizophrenia: Stable  DVT Prophylaxis: SCDs  Code Status: Full code  Disposition: Currently not medically ready   LOS: 3 days   Priscilla Finklea M.D. [facility] 06/11/2011, 9:08  AM

## 2011-06-12 LAB — SEDIMENTATION RATE: Sed Rate: 23 mm/hr — ABNORMAL HIGH (ref 0–16)

## 2011-06-12 LAB — GLUCOSE, CAPILLARY

## 2011-06-12 LAB — C-REACTIVE PROTEIN: CRP: 1.07 mg/dL — ABNORMAL HIGH (ref <0.60)

## 2011-06-12 LAB — BASIC METABOLIC PANEL
BUN: 10 mg/dL (ref 6–23)
CO2: 26 mEq/L (ref 19–32)
Calcium: 9.2 mg/dL (ref 8.4–10.5)
GFR calc non Af Amer: 80 mL/min — ABNORMAL LOW (ref >90)
Glucose, Bld: 99 mg/dL (ref 70–99)
Sodium: 141 mEq/L (ref 135–145)

## 2011-06-12 LAB — CBC
HCT: 38.1 % — ABNORMAL LOW (ref 39.0–52.0)
Hemoglobin: 12.7 g/dL — ABNORMAL LOW (ref 13.0–17.0)
MCH: 26.8 pg (ref 26.0–34.0)
MCHC: 33.3 g/dL (ref 30.0–36.0)
MCV: 80.5 fL (ref 78.0–100.0)
RBC: 4.73 MIL/uL (ref 4.22–5.81)

## 2011-06-12 MED ORDER — OXYCODONE HCL 5 MG PO TABS: 5.0000 mg | ORAL_TABLET | ORAL | Status: AC | PRN

## 2011-06-12 MED ORDER — SULFAMETHOXAZOLE-TRIMETHOPRIM 800-160 MG PO TABS: 2.0000 | ORAL_TABLET | Freq: Two times a day (BID) | ORAL | Status: DC

## 2011-06-12 MED ORDER — GABAPENTIN 300 MG PO CAPS: 300.0000 mg | ORAL_CAPSULE | Freq: Four times a day (QID) | ORAL | Status: DC

## 2011-06-12 NOTE — Discharge Summary (Signed)
Physician Discharge Summary  Patient ID: Sidharth Leverette MRN: [id number] DOB/AGE: 48-13-1967 48 y.o.  Admit date: 06/08/2011 Discharge date: 06/12/2011  Primary Care Physician:  No primary provider on file.  Discharge Diagnoses:    .Cellulitis and abscess of hand with MRSA  .Depression .Schizophrenia  Consults:  1. Hand surgery; Dr. Izora Ribas                     2. Infectious disease, Dr. Algis Liming   Discharge Medications: Current Discharge Medication List    START taking these medications   Details  oxyCODONE (OXY IR/ROXICODONE) 5 MG immediate release tablet Take 1-2 tablets (5-10 mg total) by mouth every 4 (four) hours as needed. Qty: 60 tablet, Refills: 0    sulfamethoxazole-trimethoprim (BACTRIM DS) 800-160 MG per tablet Take 2 tablets by mouth 2 (two) times daily. Qty: 120 tablet, Refills: 1      CONTINUE these medications which have CHANGED   Details  gabapentin (NEURONTIN) 300 MG capsule Take 1 capsule (300 mg total) by mouth 4 (four) times daily. Qty: 90 capsule, Refills: 1      CONTINUE these medications which have NOT CHANGED   Details  baclofen (LIORESAL) 10 MG tablet Take 10 mg by mouth 3 (three) times daily.    chlorproMAZINE (THORAZINE) 100 MG tablet Take 100 mg by mouth at bedtime.    citalopram (CELEXA) 20 MG tablet Take 20 mg by mouth daily.    clonazePAM (KLONOPIN) 0.5 MG tablet Take 0.5 mg by mouth 3 (three) times daily as needed. For anxiety    hydrOXYzine (VISTARIL) 50 MG capsule Take 50-100 mg by mouth at bedtime as needed. For sleep/anxiety    ibuprofen (ADVIL,MOTRIN) 800 MG tablet Take 800 mg by mouth 3 (three) times daily.    mirtazapine (REMERON) 15 MG tablet Take 15 mg by mouth at bedtime.    OLANZapine (ZYPREXA) 20 MG tablet Take 20 mg by mouth 2 (two) times daily.    salsalate (DISALCID) 500 MG tablet Take 500 mg by mouth 3 (three) times daily.      STOP taking these medications     HYDROcodone-acetaminophen (LORTAB) 10-500 MG per tablet            Brief H and P: For complete details please refer to admission H and P, but in brief patient is a 46 year old male with past medical history of schizophrenia, [facility] with complaints of right hand pain and swelling for at least 5 days prior to admission. On the day of admission his hand was significantly more swollen and much more painful. Hand surgery was consulted by the emergency room physician and patient was subsequently transferred to [facility] Course:  Principal Problem:  *Cellulitis and abscess of hand - Patient was admitted to the hospitalist service for cellulitis and abscess involving the hand. Hand surgery, Dr. Izora Ribas was consulted. Patient underwent first a bedside I&D on 06/08/2011, subsequently another I&D in the OR on 06/09/2011. Patient underwent I&D of deep abscess, irrigation of the extensor tendon sheath with debridement of the bone to the fifth metacarpal.  Cultures returned positive for MRSA. Patient was continued on initially vancomycin and Zosyn which was narrowed to vancomycin alone. Infectious disease consultation was obtained, patient was seen by Dr. Algis Liming. Ideally patient should be treated with 6-8 weeks of parenteral antibiotics, patient however admits to IV drug use, hence per hand surgery, Dr Izora Ribas and Dr Daiva Eves, ID, he was placed on  high-dose Bactrim twice a day for 2 months. He will followup with Dr. Izora Ribas in 7 days and Dr. Algis Liming in 10 days for followup.  Active Problems:  Depression/ Schizophrenia: Patient was continued on all his psych medications   Day of Discharge BP 138/78  Pulse 80  Temp(Src) 98 F (36.7 C) (Oral)  Resp 17  Ht 5\' 5"  (1.651 m)  Wt 65.772 kg (145 lb)  BMI 24.13 kg/m2  SpO2 100%  Physical Exam: General: Alert and awake oriented x3 not in any acute distress. HEENT: anicteric sclera, pupils reactive to light and accommodation CVS: S1-S2 clear no murmur rubs or  gallops Chest: clear to auscultation bilaterally, no wheezing rales or rhonchi Abdomen: soft nontender, nondistended, normal bowel sounds, no organomegaly Extremities: no cyanosis, clubbing or edema noted bilaterally in LExt, Right hand erythema, swelling decreased, significantly improved on the fingers. no cyanosis, clubbing or edema noted bilaterally lower extremities     The results of significant diagnostics from this hospitalization (including imaging, microbiology, ancillary and laboratory) are listed below for reference.    LAB RESULTS: Basic Metabolic Panel:  Lab 06/11/11 4098 06/11/11 0645  NA 141 140  K 3.9 4.1  CL 106 104  CO2 26 22  GLUCOSE 99 109*  BUN 10 10  CREATININE 1.08 1.06  CALCIUM 9.2 9.3  MG -- --  PHOS -- --   Liver Function Tests:  Lab 06/09/11 0448  AST 19  ALT 26  ALKPHOS 94  BILITOT 0.7  PROT 6.4  ALBUMIN 3.1*   CBC:  Lab 06/11/11 2346 06/11/11 0645 06/08/11 1339  WBC 5.6 6.7 --  NEUTROABS -- -- 7.0  HGB 12.7* 14.0 --  HCT 38.1* 41.9 --  MCV 80.5 -- --  PLT 228 220 --   Cardiac Enzymes:  Lab 06/08/11 1339  CKTOTAL 73  CKMB --  CKMBINDEX --  TROPONINI --   CBG:  Lab 06/12/11 0817 06/11/11 0733  GLUCAP 115* 123*    Significant Diagnostic Studies:  Dg Hand Complete Right  06/08/2011  *RADIOLOGY REPORT*  Clinical Data:  Increased swelling and warmth of right hand, no known injury  RIGHT HAND - COMPLETE 3+ VIEW  Comparison: None  Findings: Significant soft tissue swelling, particularly at dorsal margin of right hand extending to the ulnar border at the level of the distal fifth metacarpal and fifth MCP joint. Osseous mineralization normal for technique. Joint spaces preserved. No acute fracture or dislocation. Flexion deformities of the DIP joints of the right index and little fingers  Focal area of bony destruction identified at ulnar margin of distal fifth metacarpal. Distal aspect of lesion is 3 mm beyond the articular margin,  atypically removed from the joint space for gout. Overall lesion measures approximately 10 x 5 mm in size and shows a questionable tiny Codman triangle at distal margin. No other regional bony abnormalities identified.  IMPRESSION: Flexion deformities of the ring and little fingers, question related to remote tendon injury or current pathology, recommend clinical correlation. Marked soft tissue swelling. Lytic osseous lesion identified at ulnar margin, distal right fifth metacarpal. This could represent a bone tumor, with appearance less typical for osteomyelitis or gout. The soft tissue swelling may or may not be related to the observed fifth metacarpal bony lesion. Consider MR imaging of the right hand with and without contrast to further evaluate.  Original Report Authenticated By: Lollie Marrow, M.D.     Disposition and Follow-up: Discharge Orders    Future Orders Please Complete By  Expires   Diet - low sodium heart healthy      Increase activity slowly      Discharge wound care:      Comments:   Dressing changes TID (three times/day), remove iodoform packing, irrigate with 10cc sterile saline, gently repack, cover with 4x4, kerlix       DISPOSITION: Home  DIET: Heart healthy  ACTIVITY: As tolerated   DISCHARGE FOLLOW-UP Follow-up Information    Follow up with Molinda Bailiff, MD. Schedule an appointment as soon as possible for a visit in 1 week.   Contact information:   [address] [place] [address] [phone]       Follow up with Acey Lav, MD. Schedule an appointment as soon as possible for a visit in 10 days.   Contact information:   [address] [place] [address] [phone]       Follow up with [facility] . [facility] RN for dressing changes)    Contact information:   [phone]         Time spent on Discharge: 45 minutes  Signed:  RAI,RIPUDEEP  M.D. [facility] 06/12/2011, 11:02 AM

## 2011-06-12 NOTE — Progress Notes (Signed)
Instructed brother, Babs Sciara at [phone] how to change dressing on the right hand.  Supplies given to pt/brother to get started until [facility] can get supplies ordered.  Reviewed importance of use of antibiotics with pt and brother and reviewed Rx for oxycodone IR and neurontin.  All discharge instructions reviewed with pt and brother.  Isidoro Donning called and given brother's name and # as well as the pt's correct address.  No further questions verbalized about home care.

## 2011-06-12 NOTE — Progress Notes (Signed)
   CARE MANAGEMENT NOTE 06/12/2011  Patient:  Joseph [name],Joseph [name]   Account Number:  [id number]  Date Initiated:  06/12/2011  Documentation initiated by:  [facility]  Subjective/Objective Assessment:   hand abscess tenosynovitis and osteomyelitis with MRSA     Action/Plan:   lives at home alone, brother lives next door and assist with his care   Anticipated DC Date:  06/12/2011   Anticipated DC Plan:  HOME W HOME HEALTH SERVICES      DC Planning Services  CM consult      [facility] Choice  HOME HEALTH   Choice offered to / List presented to:  C-1 Patient        HH arranged  HH-1 RN      [facility] agency  [facility]   Status of service:  Completed, signed off Medicare Important Message given?   (If response is "NO", the following Medicare IM given date fields will be blank) Date Medicare IM given:   Date Additional Medicare IM given:    Discharge Disposition:  HOME W HOME HEALTH SERVICES  Per UR Regulation:    Comments:  06/12/2011 1045 Gave pt options for [facility] in his area.  Pt decided on [facility] for [facility] services. Contacted [facility] for [facility] RN for dressing changes. Pt has brother who will be teachable. His dressing changes are 3x per day. Isidoro Donning RN CCM Case Mgmt phone [phone]

## 2011-06-13 LAB — HEPATITIS PANEL, ACUTE
HCV Ab: REACTIVE — AB
Hepatitis B Surface Ag: NEGATIVE

## 2011-06-14 ENCOUNTER — Encounter (HOSPITAL_COMMUNITY): Payer: Self-pay | Admitting: General Surgery

## 2011-06-15 LAB — HEPATITIS B DNA, ULTRAQUANTITATIVE, PCR: Hepatitis B DNA: NOT DETECTED IU/mL (ref <20)

## 2011-06-23 ENCOUNTER — Ambulatory Visit (INDEPENDENT_AMBULATORY_CARE_PROVIDER_SITE_OTHER): Payer: Medicaid Other | Admitting: Infectious Disease

## 2011-06-23 ENCOUNTER — Encounter: Payer: Self-pay | Admitting: Infectious Disease

## 2011-06-23 DIAGNOSIS — B1920 Unspecified viral hepatitis C without hepatic coma: Secondary | ICD-10-CM

## 2011-06-23 DIAGNOSIS — L02519 Cutaneous abscess of unspecified hand: Secondary | ICD-10-CM

## 2011-06-23 DIAGNOSIS — F1990 Other psychoactive substance use, unspecified, uncomplicated: Secondary | ICD-10-CM | POA: Insufficient documentation

## 2011-06-23 DIAGNOSIS — A4902 Methicillin resistant Staphylococcus aureus infection, unspecified site: Secondary | ICD-10-CM

## 2011-06-23 DIAGNOSIS — F1910 Other psychoactive substance abuse, uncomplicated: Secondary | ICD-10-CM

## 2011-06-23 LAB — CBC WITH DIFFERENTIAL/PLATELET
Basophils Relative: 1 % (ref 0–1)
Eosinophils Absolute: 0.3 10*3/uL (ref 0.0–0.7)
Hemoglobin: 15.4 g/dL (ref 13.0–17.0)
Lymphs Abs: 2.1 10*3/uL (ref 0.7–4.0)
MCH: 26.9 pg (ref 26.0–34.0)
Monocytes Relative: 12 % (ref 3–12)
Neutro Abs: 2.3 10*3/uL (ref 1.7–7.7)
Neutrophils Relative %: 43 % (ref 43–77)
Platelets: 341 10*3/uL (ref 150–400)
RBC: 5.72 MIL/uL (ref 4.22–5.81)

## 2011-06-23 LAB — BASIC METABOLIC PANEL WITH GFR
Calcium: 9.4 mg/dL (ref 8.4–10.5)
GFR, Est African American: 50 mL/min — ABNORMAL LOW
Potassium: 4.1 mEq/L (ref 3.5–5.3)
Sodium: 138 mEq/L (ref 135–145)

## 2011-06-23 LAB — SEDIMENTATION RATE: Sed Rate: 1 mm/hr (ref 0–16)

## 2011-06-23 LAB — C-REACTIVE PROTEIN: CRP: 0.05 mg/dL (ref <0.60)

## 2011-06-23 NOTE — Assessment & Plan Note (Signed)
He actually has osteomyelitis as well by imaging. Will complete 8 wks of therapy. Check esr, crp and safety labs today

## 2011-06-23 NOTE — Progress Notes (Signed)
Subjective:    Patient ID: Joseph [name], male    DOB: 19-Oct-1965, 46 y.o.   MRN: [id number]  HPI  46 y.o. male with a past medical history of schizophrenia IVDU prior incarceration, Hepatitis B , who presented [facility] hospital emergency room with complaints of right hand pain and swelling. He had erythema and pain of his right hand for at least 5 days prior but upon waking up today he notes that his hand was significantly more swollen and much more painful> He also had noticed two boils between scrotum and right thigh. He was admitted to [facility] placed on vancomycin and zosyn and now has undergone I and D of deep abscess, irrigation of the extensor tendon sheath with debridement of bone by Dr. Izora Ribas on April 17th. Intraoperative cultures  shoiwing MRSA and abx have were simplifeid to vancomycin alone, and he was dc on TMP/SMX DS two bid which he has continued thrrough today with plans of 8 weeks of therapy. HE had improved as an outpatient with reduction in size of wound where it is packed and reduction in pain. No fevers, chills, nausea or malaise.   Review of Systems  Constitutional: Negative for fever, chills, diaphoresis, activity change, appetite change, fatigue and unexpected weight change.  HENT: Negative for congestion, sore throat, rhinorrhea, sneezing, trouble swallowing and sinus pressure.   Eyes: Negative for photophobia and visual disturbance.  Respiratory: Negative for cough, chest tightness, shortness of breath, wheezing and stridor.   Cardiovascular: Negative for chest pain, palpitations and leg swelling.  Gastrointestinal: Negative for nausea, vomiting, abdominal pain, diarrhea, constipation, blood in stool, abdominal distention and anal bleeding.  Genitourinary: Negative for dysuria, hematuria, flank pain and difficulty urinating.  Musculoskeletal: Negative for myalgias, back pain, joint swelling, arthralgias and gait problem.  Skin: Positive for wound. Negative for color change,  pallor and rash.  Neurological: Negative for dizziness, tremors, weakness and light-headedness.  Hematological: Negative for adenopathy. Does not bruise/bleed easily.  Psychiatric/Behavioral: Negative for behavioral problems, confusion, sleep disturbance, dysphoric mood, decreased concentration and agitation.       Objective:   Physical Exam  Constitutional: He is oriented to person, place, and time. He appears well-developed and well-nourished. No distress.  HENT:  Head: Normocephalic and atraumatic.  Mouth/Throat: Oropharynx is clear and moist. No oropharyngeal exudate.  Eyes: Conjunctivae and EOM are normal. Pupils are equal, round, and reactive to light. No scleral icterus.  Neck: Normal range of motion. Neck supple. No JVD present.  Cardiovascular: Normal rate, regular rhythm and normal heart sounds.  Exam reveals no gallop and no friction rub.   No murmur heard. Pulmonary/Chest: Effort normal and breath sounds normal. No respiratory distress. He has no wheezes. He has no rales. He exhibits no tenderness.  Abdominal: He exhibits no distension and no mass. There is no tenderness. There is no rebound and no guarding.  Musculoskeletal: He exhibits no edema and no tenderness.  Lymphadenopathy:    He has no cervical adenopathy.  Neurological: He is alert and oriented to person, place, and time. He has normal reflexes. He exhibits normal muscle tone. Coordination normal.  Skin: Skin is warm and dry. He is not diaphoretic. There is erythema. No pallor.     Psychiatric: He has a normal mood and affect. His behavior is normal. Judgment and thought content normal.          Assessment & Plan:  MRSA (methicillin resistant Staphylococcus aureus) infection Culprit pathogen see plan re abscess, will decolonize after finishing hterapy  Cellulitis and abscess of hand He actually has osteomyelitis as well by imaging. Will complete 8 wks of therapy. Check esr, crp and safety labs  today  Hepatitis C Check genotype and hep c viral load at next visit  IVDU (intravenous drug user) Reportedly in remission

## 2011-06-23 NOTE — Assessment & Plan Note (Signed)
Check genotype and hep c viral load at next visit

## 2011-06-23 NOTE — Assessment & Plan Note (Signed)
Reportedly in remission.  ?

## 2011-06-23 NOTE — Assessment & Plan Note (Signed)
Culprit pathogen see plan re abscess, will decolonize after finishing hterapy

## 2011-06-24 ENCOUNTER — Telehealth: Payer: Self-pay | Admitting: Infectious Disease

## 2011-06-24 DIAGNOSIS — Z22322 Carrier or suspected carrier of Methicillin resistant Staphylococcus aureus: Secondary | ICD-10-CM

## 2011-06-24 MED ORDER — SULFAMETHOXAZOLE-TRIMETHOPRIM 800-160 MG PO TABS: 1.0000 | ORAL_TABLET | Freq: Two times a day (BID) | ORAL | Status: DC

## 2011-06-24 NOTE — Telephone Encounter (Signed)
Called Axtell Goh due his serum creatinine being up to 1.8. This may be a pseudoelevelation due to the tmp/smx.  I have asked him to :  Stop the salsalate Stop the ibuprofen  Decrease the dose of bactrim to ONE ds bid and come back for repeat blood work on Monday

## 2011-06-27 ENCOUNTER — Other Ambulatory Visit: Payer: Medicaid Other

## 2011-06-27 DIAGNOSIS — Z22322 Carrier or suspected carrier of Methicillin resistant Staphylococcus aureus: Secondary | ICD-10-CM

## 2011-06-28 LAB — BASIC METABOLIC PANEL WITH GFR
Chloride: 105 mEq/L (ref 96–112)
GFR, Est African American: 69 mL/min
GFR, Est Non African American: 59 mL/min — ABNORMAL LOW
Potassium: 4.4 mEq/L (ref 3.5–5.3)
Sodium: 138 mEq/L (ref 135–145)

## 2011-08-01 ENCOUNTER — Telehealth: Payer: Self-pay | Admitting: *Deleted

## 2011-08-01 DIAGNOSIS — L02519 Cutaneous abscess of unspecified hand: Secondary | ICD-10-CM

## 2011-08-01 MED ORDER — SULFAMETHOXAZOLE-TRIMETHOPRIM 800-160 MG PO TABS: 2.0000 | ORAL_TABLET | Freq: Two times a day (BID) | ORAL | Status: DC

## 2011-08-01 NOTE — Telephone Encounter (Signed)
[place] Septra [place] 2 tablets 2 times a day.

## 2011-08-11 ENCOUNTER — Encounter: Payer: Self-pay | Admitting: Infectious Disease

## 2011-08-11 ENCOUNTER — Ambulatory Visit (INDEPENDENT_AMBULATORY_CARE_PROVIDER_SITE_OTHER): Payer: Medicaid Other | Admitting: Infectious Disease

## 2011-08-11 VITALS — BP 118/79 | HR 85 | Temp 97.5°F | Wt 172.2 lb

## 2011-08-11 DIAGNOSIS — L03119 Cellulitis of unspecified part of limb: Secondary | ICD-10-CM

## 2011-08-11 DIAGNOSIS — B1920 Unspecified viral hepatitis C without hepatic coma: Secondary | ICD-10-CM

## 2011-08-11 DIAGNOSIS — L02519 Cutaneous abscess of unspecified hand: Secondary | ICD-10-CM

## 2011-08-11 DIAGNOSIS — A4902 Methicillin resistant Staphylococcus aureus infection, unspecified site: Secondary | ICD-10-CM

## 2011-08-11 LAB — CBC WITH DIFFERENTIAL/PLATELET
Eosinophils Absolute: 0.3 10*3/uL (ref 0.0–0.7)
Lymphocytes Relative: 30 % (ref 12–46)
Lymphs Abs: 1.6 10*3/uL (ref 0.7–4.0)
MCH: 27.5 pg (ref 26.0–34.0)
Neutro Abs: 3 10*3/uL (ref 1.7–7.7)
Neutrophils Relative %: 55 % (ref 43–77)
Platelets: 224 10*3/uL (ref 150–400)
RBC: 5.42 MIL/uL (ref 4.22–5.81)
WBC: 5.4 10*3/uL (ref 4.0–10.5)

## 2011-08-11 LAB — BASIC METABOLIC PANEL WITH GFR
Calcium: 8.9 mg/dL (ref 8.4–10.5)
GFR, Est African American: 62 mL/min
GFR, Est Non African American: 53 mL/min — ABNORMAL LOW
Potassium: 4.7 mEq/L (ref 3.5–5.3)
Sodium: 139 mEq/L (ref 135–145)

## 2011-08-11 LAB — C-REACTIVE PROTEIN: CRP: 0.56 mg/dL (ref <0.60)

## 2011-08-11 MED ORDER — MUPIROCIN 2 % EX OINT: TOPICAL_OINTMENT | CUTANEOUS | Status: DC

## 2011-08-11 MED ORDER — CHLORHEXIDINE GLUCONATE 4 % EX LIQD: 60.0000 mL | Freq: Every day | CUTANEOUS | Status: DC

## 2011-08-11 NOTE — Assessment & Plan Note (Signed)
Stop antibiotics. Check esr and crp.

## 2011-08-11 NOTE — Progress Notes (Signed)
Subjective:    Patient ID: Joseph [name], male    DOB: May 12, 1966, 46 y.o.   MRN: [id number]  HPI  46y.o. male with a past medical history of schizophrenia IVDU prior incarceration, Hepatitis B , who presented [facility] hospital emergency room with complaints of right hand pain and swelling. He had erythema and pain of his right hand for at least 5 days prior but upon waking up noted that his hand was significantly more swollen and much more painful> He also had noticed two boils between scrotum and right thigh. He was admitted to [facility] placed on vancomycin and zosyn and  has undergone I and D of deep abscess, irrigation of the extensor tendon sheath with debridement of bone by Dr. Izora Ribas on April 17th. Intraoperative cultures shoiwing MRSA and abx have were simplifeid to vancomycin alone, and he was dc on TMP/SMX DS two bid which he has continued thrrough today with plans of 8 weeks of therapy. HE had improved as an outpatient with reduction in size of wound where was packed with reduction in pain. I saw him in late January. His hand is looking great and appears to have completely resolved infection> Dr. Izora Ribas has dc him from his clinic. Pt still thinks he has hep b not c but labs show ab to C . I will work this up. Also will give him a decolonization regimen for his MRSA.   Review of Systems  Constitutional: Negative for fever, chills, diaphoresis, activity change, appetite change, fatigue and unexpected weight change.  HENT: Negative for congestion, sore throat, rhinorrhea, sneezing, trouble swallowing and sinus pressure.   Eyes: Negative for photophobia and visual disturbance.  Respiratory: Negative for cough, chest tightness, shortness of breath, wheezing and stridor.   Cardiovascular: Negative for chest pain, palpitations and leg swelling.  Gastrointestinal: Negative for nausea, vomiting, abdominal pain, diarrhea, constipation, blood in stool, abdominal distention and anal bleeding.    Genitourinary: Negative for dysuria, hematuria, flank pain and difficulty urinating.  Musculoskeletal: Negative for myalgias, back pain, joint swelling, arthralgias and gait problem.  Skin: Negative for color change, pallor, rash and wound.  Neurological: Negative for dizziness, tremors, weakness and light-headedness.  Hematological: Negative for adenopathy. Does not bruise/bleed easily.  Psychiatric/Behavioral: Negative for behavioral problems, confusion, sleep disturbance, dysphoric mood, decreased concentration and agitation.       Objective:   Physical Exam  Constitutional: He is oriented to person, place, and time. He appears well-developed and well-nourished. No distress.  HENT:  Head: Normocephalic and atraumatic.  Mouth/Throat: Oropharynx is clear and moist. No oropharyngeal exudate.  Eyes: Conjunctivae and EOM are normal. Pupils are equal, round, and reactive to light. No scleral icterus.  Neck: Normal range of motion. Neck supple. No JVD present.  Cardiovascular: Normal rate, regular rhythm and normal heart sounds.  Exam reveals no gallop and no friction rub.   No murmur heard. Pulmonary/Chest: Effort normal and breath sounds normal. No respiratory distress. He has no wheezes. He has no rales. He exhibits no tenderness.  Abdominal: He exhibits no distension and no mass. There is no tenderness. There is no rebound and no guarding.  Musculoskeletal: He exhibits no edema and no tenderness.       Arms: Lymphadenopathy:    He has no cervical adenopathy.  Neurological: He is alert and oriented to person, place, and time. He has normal reflexes. He exhibits normal muscle tone. Coordination normal.  Skin: Skin is warm and dry. He is not diaphoretic. No erythema. No pallor.  Psychiatric: He has a normal mood and affect. His behavior is normal. Judgment and thought content normal.          Assessment & Plan:  Cellulitis and abscess of hand Stop antibiotics. Check esr and crp.    MRSA (methicillin resistant Staphylococcus aureus) infection decolonize  Hepatitis C Check Hep c rna and genotype

## 2011-08-11 NOTE — Assessment & Plan Note (Signed)
Check Hep c rna and genotype

## 2011-08-11 NOTE — Assessment & Plan Note (Signed)
decolonize

## 2011-08-12 LAB — SEDIMENTATION RATE: Sed Rate: 1 mm/hr (ref 0–16)

## 2011-08-15 ENCOUNTER — Ambulatory Visit: Payer: Medicaid Other | Admitting: Emergency Medicine

## 2011-08-16 LAB — HCV RNA QUANT RFLX ULTRA OR GENOTYP
HCV Quantitative Log: 7.39 {Log} — ABNORMAL HIGH (ref <1.63)
HCV Quantitative: 24400218 IU/mL — ABNORMAL HIGH (ref <43)

## 2011-08-17 ENCOUNTER — Other Ambulatory Visit: Payer: Self-pay | Admitting: *Deleted

## 2011-08-17 DIAGNOSIS — A4902 Methicillin resistant Staphylococcus aureus infection, unspecified site: Secondary | ICD-10-CM

## 2011-08-17 LAB — HEPATITIS C GENOTYPE

## 2011-08-17 MED ORDER — MUPIROCIN 2 % EX OINT: TOPICAL_OINTMENT | CUTANEOUS | Status: AC

## 2011-08-17 MED ORDER — CHLORHEXIDINE GLUCONATE 4 % EX LIQD: 60.0000 mL | Freq: Every day | CUTANEOUS | Status: AC

## 2012-01-06 ENCOUNTER — Encounter (HOSPITAL_COMMUNITY): Payer: Self-pay | Admitting: *Deleted

## 2012-01-06 ENCOUNTER — Emergency Department (HOSPITAL_COMMUNITY): Payer: Medicaid Other

## 2012-01-06 ENCOUNTER — Emergency Department (HOSPITAL_COMMUNITY)
Admission: EM | Admit: 2012-01-06 | Discharge: 2012-01-06 | Disposition: A | Discharge status: Home / Self Care | Payer: Medicaid Other | Attending: Emergency Medicine | Admitting: Emergency Medicine

## 2012-01-06 DIAGNOSIS — Z9089 Acquired absence of other organs: Secondary | ICD-10-CM | POA: Insufficient documentation

## 2012-01-06 DIAGNOSIS — R112 Nausea with vomiting, unspecified: Secondary | ICD-10-CM

## 2012-01-06 DIAGNOSIS — F341 Dysthymic disorder: Secondary | ICD-10-CM | POA: Insufficient documentation

## 2012-01-06 DIAGNOSIS — J4489 Other specified chronic obstructive pulmonary disease: Secondary | ICD-10-CM | POA: Insufficient documentation

## 2012-01-06 DIAGNOSIS — Z8659 Personal history of other mental and behavioral disorders: Secondary | ICD-10-CM | POA: Insufficient documentation

## 2012-01-06 DIAGNOSIS — J449 Chronic obstructive pulmonary disease, unspecified: Secondary | ICD-10-CM | POA: Insufficient documentation

## 2012-01-06 DIAGNOSIS — R197 Diarrhea, unspecified: Secondary | ICD-10-CM | POA: Insufficient documentation

## 2012-01-06 DIAGNOSIS — Z79899 Other long term (current) drug therapy: Secondary | ICD-10-CM | POA: Insufficient documentation

## 2012-01-06 DIAGNOSIS — F17200 Nicotine dependence, unspecified, uncomplicated: Secondary | ICD-10-CM | POA: Insufficient documentation

## 2012-01-06 MED ORDER — ONDANSETRON HCL 4 MG PO TABS: 4.0000 mg | ORAL_TABLET | Freq: Four times a day (QID) | ORAL | Status: AC

## 2012-01-06 MED ORDER — HYDROMORPHONE HCL PF 1 MG/ML IJ SOLN
1.0000 mg | Freq: Once | INTRAMUSCULAR | Status: AC
  Administered 2012-01-06: 1 mg via INTRAVENOUS
  Filled 2012-01-06: qty 1

## 2012-01-06 MED ORDER — ONDANSETRON HCL 4 MG/2ML IJ SOLN
4.0000 mg | Freq: Once | INTRAMUSCULAR | Status: AC
  Administered 2012-01-06: 4 mg via INTRAVENOUS
  Filled 2012-01-06: qty 2

## 2012-01-06 MED ORDER — SODIUM CHLORIDE 0.9 % IV BOLUS (SEPSIS)
1000.0000 mL | Freq: Once | INTRAVENOUS | Status: AC
  Administered 2012-01-06: 1000 mL via INTRAVENOUS

## 2012-01-06 NOTE — ED Notes (Signed)
C/o vomiting and unable to keep anything down, also has a knot on right lower leg at surgical site, states he has surgery 6-8 weeks ago for a broken leg. Still wearing a splint, scheduled for recheck next week

## 2012-01-06 NOTE — ED Notes (Signed)
Discharge instructions reviewed.

## 2012-01-13 NOTE — ED Provider Notes (Signed)
History    46yM with n/v/d. Onset about a day ago. Persistent. Multiple episodes of each no blood in stool or vomitus. No fever or chills. No melena. NO urinary complaints. No sick contacts. NO significant abdominal pain. Pt also noticed a knot on his shin. Recent surgery about 2 months ago for tib/fib fx done in [place]. No rash. No acute numbness or tingling.   CSN: [id number]  Arrival date & time 01/06/12  1417   First MD Initiated Contact with Patient 01/06/12 1511      Chief Complaint  Patient presents with  . Emesis    (Consider location/radiation/quality/duration/timing/severity/associated sxs/prior treatment) HPI  Past Medical History  Diagnosis Date  . Schizophrenia   . Depression   . Anxiety   . Nerve damage   . Asthma   . COPD (chronic obstructive pulmonary disease)   . Pneumonia   . Chronic bronchitis   . Hepatitis 1990's    "B"  . Schizophrenia     Past Surgical History  Procedure Date  . I&d extremity 06/08/11    right hand  . Tendon repair 1980's    right hand  . Tonsillectomy     "when I was a kid"  . Incision and drainage of wound 1980's    left knee S/P "cut it w/a chainsaw"  . Appendectomy     "when I was young"  . I&d extremity 06/09/2011    Procedure: IRRIGATION AND DEBRIDEMENT EXTREMITY;  Surgeon: Johnette Abraham, MD;  Location: [facility] OR;  Service: Plastics;  Laterality: Right;    No family history on file.  History  Substance Use Topics  . Smoking status: Current Everyday Smoker -- 1.5 packs/day for 37 years    Types: Cigarettes  . Smokeless tobacco: Never Used  . Alcohol Use: 0.6 oz/week    1 Cans of beer per week      Review of Systems   Review of symptoms negative unless otherwise noted in HPI.   Allergies  Review of patient's allergies indicates no known allergies.  Home Medications   Current Outpatient Rx  Name Route Sig Dispense Refill  . ALBUTEROL SULFATE HFA 108 (90 BASE) MCG/ACT IN AERS Inhalation Inhale 2 puffs  into the lungs every 6 (six) hours as needed.    Marland Kitchen BACLOFEN 10 MG PO TABS Oral Take 10 mg by mouth at bedtime.     . CHLORPROMAZINE HCL 100 MG PO TABS Oral Take 100 mg by mouth at bedtime.    Marland Kitchen CITALOPRAM HYDROBROMIDE 20 MG PO TABS Oral Take 20 mg by mouth 2 (two) times daily.     Marland Kitchen CLONAZEPAM 0.5 MG PO TABS Oral Take 0.5 mg by mouth 3 (three) times daily as needed. For anxiety    . FLUTICASONE-SALMETEROL 250-50 MCG/DOSE IN AEPB Inhalation Inhale 1 puff into the lungs every 12 (twelve) hours.    Marland Kitchen GABAPENTIN 300 MG PO CAPS Oral Take 300 mg by mouth at bedtime. PRESCRIBED: Take one by mouth 4 times daily    . HYDROCODONE-ACETAMINOPHEN 7.5-325 MG PO TABS Oral Take 1 tablet by mouth every 6 (six) hours as needed.    Marland Kitchen HYDROXYZINE PAMOATE 50 MG PO CAPS Oral Take 100 mg by mouth at bedtime. For sleep/anxiety    . LISINOPRIL 10 MG PO TABS Oral Take 10 mg by mouth daily.    Marland Kitchen MIRTAZAPINE 15 MG PO TABS Oral Take 30 mg by mouth at bedtime.     Marland Kitchen OLANZAPINE 20 MG PO TABS Oral Take  20 mg by mouth 2 (two) times daily.    . TRAMADOL HCL 50 MG PO TABS Oral Take 50 mg by mouth 2 (two) times daily.    Marland Kitchen ONDANSETRON HCL 4 MG PO TABS Oral Take 1 tablet (4 mg total) by mouth every 6 (six) hours. 12 tablet 0    BP 99/55  Pulse 87  Temp 98.2 F (36.8 C) (Oral)  Resp 20  Ht 5\' 5"  (1.651 m)  Wt 145 lb (65.772 kg)  BMI 24.13 kg/m2  SpO2 97%  Physical Exam  Nursing note and vitals reviewed. Constitutional: He appears well-developed and well-nourished. No distress.  HENT:  Head: Normocephalic and atraumatic.  Eyes: Conjunctivae are normal. Pupils are equal, round, and reactive to light. Right eye exhibits no discharge. Left eye exhibits no discharge.  Neck: Neck supple.  Cardiovascular: Normal rate, regular rhythm and normal heart sounds.  Exam reveals no gallop and no friction rub.   No murmur heard. Pulmonary/Chest: Effort normal and breath sounds normal. No respiratory distress.  Abdominal: Soft. He  exhibits no distension. There is no tenderness.  Genitourinary:       No cva tenderness  Musculoskeletal: Normal range of motion. He exhibits no edema and no tenderness.       Surgical site R lower leg healing well. NV intact distally. Hard lesion near mid tibia consistent with bony callous.  Neurological: He is alert.  Skin: Skin is warm and dry.  Psychiatric: He has a normal mood and affect. His behavior is normal. Thought content normal.    ED Course  Procedures (including critical care time)  Labs Reviewed - No data to display No results found.   1. Nausea and vomiting   2. Diarrhea       MDM  46yM with n/v/d. Suspect viral illness. "Knot" on leg likely callous formation. XR look appropriate for recent surgery as he describes. No evidence of infectious process, DVT or vascular compromise. Abdomen benign. Afebrile and well appearing. Low suspicion for emergent abdominal surgical process.        Raeford Razor, MD 01/13/12 1036

## 2012-03-18 ENCOUNTER — Encounter (HOSPITAL_COMMUNITY): Payer: Self-pay | Admitting: Emergency Medicine

## 2012-03-18 ENCOUNTER — Emergency Department (HOSPITAL_COMMUNITY)
Admission: EM | Admit: 2012-03-18 | Discharge: 2012-03-18 | Disposition: A | Discharge status: Home / Self Care | Payer: Medicaid Other | Attending: Emergency Medicine | Admitting: Emergency Medicine

## 2012-03-18 ENCOUNTER — Emergency Department (HOSPITAL_COMMUNITY): Payer: Medicaid Other

## 2012-03-18 DIAGNOSIS — M79609 Pain in unspecified limb: Secondary | ICD-10-CM | POA: Insufficient documentation

## 2012-03-18 DIAGNOSIS — Z8709 Personal history of other diseases of the respiratory system: Secondary | ICD-10-CM | POA: Insufficient documentation

## 2012-03-18 DIAGNOSIS — J449 Chronic obstructive pulmonary disease, unspecified: Secondary | ICD-10-CM | POA: Insufficient documentation

## 2012-03-18 DIAGNOSIS — J40 Bronchitis, not specified as acute or chronic: Secondary | ICD-10-CM | POA: Insufficient documentation

## 2012-03-18 DIAGNOSIS — F17200 Nicotine dependence, unspecified, uncomplicated: Secondary | ICD-10-CM | POA: Insufficient documentation

## 2012-03-18 DIAGNOSIS — J3489 Other specified disorders of nose and nasal sinuses: Secondary | ICD-10-CM | POA: Insufficient documentation

## 2012-03-18 DIAGNOSIS — Z79899 Other long term (current) drug therapy: Secondary | ICD-10-CM | POA: Insufficient documentation

## 2012-03-18 DIAGNOSIS — J45909 Unspecified asthma, uncomplicated: Secondary | ICD-10-CM | POA: Insufficient documentation

## 2012-03-18 DIAGNOSIS — J4489 Other specified chronic obstructive pulmonary disease: Secondary | ICD-10-CM | POA: Insufficient documentation

## 2012-03-18 DIAGNOSIS — F209 Schizophrenia, unspecified: Secondary | ICD-10-CM | POA: Insufficient documentation

## 2012-03-18 DIAGNOSIS — Z8719 Personal history of other diseases of the digestive system: Secondary | ICD-10-CM | POA: Insufficient documentation

## 2012-03-18 MED ORDER — AMOXICILLIN 500 MG PO CAPS: 500.0000 mg | ORAL_CAPSULE | Freq: Three times a day (TID) | ORAL | Status: DC

## 2012-03-18 MED ORDER — TRAMADOL HCL 50 MG PO TABS: 50.0000 mg | ORAL_TABLET | Freq: Four times a day (QID) | ORAL | Status: DC | PRN

## 2012-03-18 NOTE — ED Provider Notes (Signed)
History   This chart was scribed for Benny Lennert, MD by Melba Coon. The patient was seen in room APA01/APA01 and the patient's care was started at 3:06PM.    CSN: [id number]  Arrival date & time 03/18/12  1319   First MD Initiated Contact with Patient 03/18/12 1503      Chief Complaint  Patient presents with  . Leg Pain  . Nasal Congestion    (Consider location/radiation/quality/duration/timing/severity/associated sxs/prior treatment) Patient is a 46 y.o. male presenting with cough and leg pain. The history is provided by the patient. No language interpreter was used.  Cough This is a new problem. The current episode started more than 2 days ago (5 days ago). The problem occurs constantly. The problem has not changed since onset.The cough is non-productive. There has been no fever. Associated symptoms include chills. Pertinent negatives include no chest pain and no headaches.  Leg Pain  The incident occurred more than 1 week ago (chronic since June). Injury mechanism: broke his leg. The pain is moderate. The pain has been constant since onset. The symptoms are aggravated by bearing weight and activity. He has tried nothing for the symptoms.  No known allergies. No other pertinent medical symptoms.  Past Medical History  Diagnosis Date  . Schizophrenia   . Depression   . Anxiety   . Nerve damage   . Asthma   . COPD (chronic obstructive pulmonary disease)   . Pneumonia   . Chronic bronchitis   . Hepatitis 1990's    "B"  . Schizophrenia     Past Surgical History  Procedure Date  . I&d extremity 06/08/11    right hand  . Tendon repair 1980's    right hand  . Tonsillectomy     "when I was a kid"  . Incision and drainage of wound 1980's    left knee S/P "cut it w/a chainsaw"  . Appendectomy     "when I was young"  . I&d extremity 06/09/2011    Procedure: IRRIGATION AND DEBRIDEMENT EXTREMITY;  Surgeon: Johnette Abraham, MD;  Location: [facility] OR;  Service: Plastics;   Laterality: Right;    No family history on file.  History  Substance Use Topics  . Smoking status: Current Every Day Smoker -- 1.5 packs/day for 37 years    Types: Cigarettes  . Smokeless tobacco: Never Used  . Alcohol Use: 0.6 oz/week    1 Cans of beer per week      Review of Systems  Constitutional: Positive for chills. Negative for fatigue.  HENT: Negative for congestion, sinus pressure and ear discharge.   Eyes: Negative for discharge.  Respiratory: Positive for cough.   Cardiovascular: Negative for chest pain.  Gastrointestinal: Negative for abdominal pain and diarrhea.  Genitourinary: Negative for frequency and hematuria.  Musculoskeletal: Positive for arthralgias (right leg pain). Negative for back pain.  Skin: Negative for rash.  Neurological: Negative for seizures and headaches.  Hematological: Negative.   Psychiatric/Behavioral: Negative for hallucinations.   10 Systems reviewed and all are negative for acute change except as noted in the HPI.   Allergies  Review of patient's allergies indicates no known allergies.  Home Medications   Current Outpatient Rx  Name Route Sig Dispense Refill  . ALBUTEROL SULFATE HFA 108 (90 BASE) MCG/ACT IN AERS Inhalation Inhale 2 puffs into the lungs every 6 (six) hours as needed.    Marland Kitchen BACLOFEN 10 MG PO TABS Oral Take 10 mg by mouth at bedtime.     Marland Kitchen  CHLORPROMAZINE HCL 100 MG PO TABS Oral Take 100 mg by mouth at bedtime.    Marland Kitchen CITALOPRAM HYDROBROMIDE 20 MG PO TABS Oral Take 20 mg by mouth 2 (two) times daily.     Marland Kitchen CLONAZEPAM 0.5 MG PO TABS Oral Take 0.5 mg by mouth 3 (three) times daily as needed. For anxiety    . FLUTICASONE-SALMETEROL 250-50 MCG/DOSE IN AEPB Inhalation Inhale 1 puff into the lungs every 12 (twelve) hours.    Marland Kitchen GABAPENTIN 300 MG PO CAPS Oral Take 300 mg by mouth at bedtime. PRESCRIBED: Take one by mouth 4 times daily    . HYDROCODONE-ACETAMINOPHEN 7.5-325 MG PO TABS Oral Take 1 tablet by mouth every 6 (six)  hours as needed.    Marland Kitchen HYDROXYZINE PAMOATE 50 MG PO CAPS Oral Take 100 mg by mouth at bedtime. For sleep/anxiety    . LISINOPRIL 10 MG PO TABS Oral Take 10 mg by mouth daily.    Marland Kitchen MIRTAZAPINE 15 MG PO TABS Oral Take 30 mg by mouth at bedtime.     Marland Kitchen OLANZAPINE 20 MG PO TABS Oral Take 20 mg by mouth 2 (two) times daily.    . TRAMADOL HCL 50 MG PO TABS Oral Take 50 mg by mouth 2 (two) times daily.      BP 119/85  Pulse 92  Temp 97.9 F (36.6 C) (Oral)  Resp 18  Ht 5\' 5"  (1.651 m)  Wt 145 lb (65.772 kg)  BMI 24.13 kg/m2  SpO2 98%  Physical Exam  Nursing note and vitals reviewed. Constitutional:       Awake, alert, nontoxic appearance.  HENT:  Head: Normocephalic and atraumatic.  Eyes: Right eye exhibits no discharge. Left eye exhibits no discharge.  Neck: Neck supple.  Cardiovascular: Normal rate, regular rhythm, normal heart sounds and intact distal pulses.   Pulmonary/Chest: Effort normal. He has wheezes (minimal bilateral wheezing). He exhibits no tenderness.  Abdominal: Soft. There is no tenderness. There is no rebound.  Musculoskeletal: He exhibits edema (right mid tibia) and tenderness (right mid tibia).       Baseline ROM, no obvious new focal weakness.  Neurological:       Mental status and motor strength appears baseline for patient and situation.  Skin: No rash noted.  Psychiatric: He has a normal mood and affect.    ED Course  Procedures (including critical care time)  DIAGNOSTIC STUDIES: Oxygen Saturation is 98% on room air, normal by my interpretation.    COORDINATION OF CARE:  3:10PM - CXR and right tibia/fibula XR will be ordered for Mr Bartling.   Labs Reviewed - No data to display No results found.   No diagnosis found.    MDM    The chart was scribed for me under my direct supervision.  I personally performed the history, physical, and medical decision making and all procedures in the evaluation of this patient.Benny Lennert,  MD 03/18/12 1620

## 2012-03-18 NOTE — ED Notes (Signed)
Pt c/o cold sx x 5 days and rle pain since having surgery in June.

## 2012-03-21 ENCOUNTER — Emergency Department: Payer: Self-pay | Admitting: Emergency Medicine

## 2012-05-07 LAB — CBC
Albumin: 4.3
Alkaline Phosphatase: 185 U/L
Glucose: 106
HCT: 42 %
Hemoglobin: 13.6 g/dL (ref 13.5–17.5)
Total Bilirubin: 0.5 mg/dL
Total Protein: 6.5 g/dL
WBC: 5.9
platelet count: 286

## 2012-06-26 ENCOUNTER — Ambulatory Visit (INDEPENDENT_AMBULATORY_CARE_PROVIDER_SITE_OTHER): Payer: Medicaid Other | Admitting: Urgent Care

## 2012-06-26 ENCOUNTER — Encounter: Payer: Self-pay | Admitting: Urgent Care

## 2012-06-26 VITALS — BP 104/67 | HR 85 | Temp 97.4°F | Ht 65.0 in | Wt 160.2 lb

## 2012-06-26 DIAGNOSIS — B1920 Unspecified viral hepatitis C without hepatic coma: Secondary | ICD-10-CM

## 2012-06-26 DIAGNOSIS — R748 Abnormal levels of other serum enzymes: Secondary | ICD-10-CM | POA: Insufficient documentation

## 2012-06-26 DIAGNOSIS — F1910 Other psychoactive substance abuse, uncomplicated: Secondary | ICD-10-CM | POA: Insufficient documentation

## 2012-06-26 NOTE — Assessment & Plan Note (Signed)
Mildly elevated alkaline phosphatase most likely secondary to cholestasis.  Given increased risk of HCC with chronic hepatitis C, RUQ ultrasound for further evaluation.

## 2012-06-26 NOTE — Progress Notes (Signed)
Referring Provider: Dr. Calla Kicks [facility]) Primary Care Physician:  Dr. Reinaldo Berber Primary Gastroenterologist:  Dr. Jena Gauss  Chief Complaint  Patient presents with  . Hepatitis    HPI:  Joseph [name] is a 47 y.o. male here as a referral from [facility] for hepatitis.  He gives hx of hepatitis B diagnosed many years ago before he went to prison.  He thinks that it went away on it's own.  He was recently found to have a positive hepatitis C AB.  In review of his old labs, he had a positive HCV RNA by PCR in March 2013 & was found to have chronic genotype 1a Hepatitis C.  He gives hx of heavy etoh for many years, but reports he quit 8 yrs ago.  He reports being in & out of prison for many years.  He has schizophrenia & is followed by a psychiatrist in [place].  He reports recent marijuana use.  He uses vicodin as needed for pain.  Denies current IV drug or IN drug use, but there is hx of IV drug use in the past.  No hx transfusion.  Hx multiple sexual partners.  He reports weight is stable, appetite fine.  Denies fever, chills, abdominal pain, jaundice, pruiritis, or clay colored stools.   Denies constipation, diarrhea, rectal bleeding, or melena.   Denies nausea, vomiting, dysphagia, odynophagia or anorexia.  Heartburn on rolaids prn daily.  Denies increasing abdominal girth.  He has an Alkaline phosphatase of 185.  Bilirubin, AST, & ALT normal.  Review of labs from Dec 2013:  Normal CBC.  Hep B sAB negative.  Normal CMP except as mentioned above.  Component     Latest Ref Rng 08/11/2011  HCV Quantitative     <43 IU/mL [id number] (H)  HCV Quantitative Log     <1.63 log 10 7.39 (H)   Component     Latest Ref Rng 06/11/2011  Hepatitis B DNA     <20 IU/mL Not Detected  Hepatitis B DNA (Calc)     <116 copies/mL NOT CALC   Past Medical History  Diagnosis Date  . Schizophrenia     Dr Vedia Coffer [facility])  . Depression   . Anxiety   . Nerve damage   . Asthma   . COPD  (chronic obstructive pulmonary disease)   . Pneumonia   . Chronic bronchitis   . Hepatitis 1990's    "B"  . Schizophrenia   . Alcohol abuse   . Hypertension     Past Surgical History  Procedure Date  . I&d extremity 06/08/11    right hand  . Tendon repair 1980's    right hand  . Tonsillectomy     "when I was a kid"  . Incision and drainage of wound 1980's    left knee S/P "cut it w/a chainsaw"  . Appendectomy     "when I was young"  . I&d extremity 06/09/2011    Procedure: IRRIGATION AND DEBRIDEMENT EXTREMITY;  Surgeon: Johnette Abraham, MD;  Location: [facility] OR;  Service: Plastics;  Laterality: Right;    Current Outpatient Prescriptions  Medication Sig Dispense Refill  . albuterol (VENTOLIN HFA) 108 (90 BASE) MCG/ACT inhaler Inhale 2 puffs into the lungs every 6 (six) hours as needed. Shortness of breath      . chlorproMAZINE (THORAZINE) 100 MG tablet Take 100 mg by mouth at bedtime.      . citalopram (CELEXA) 20 MG tablet Take 20 mg by mouth daily.       Marland Kitchen  clonazePAM (KLONOPIN) 0.5 MG tablet Take 0.5 mg by mouth 3 (three) times daily as needed. For anxiety      . HYDROcodone-acetaminophen (NORCO) 7.5-325 MG per tablet Take 1 tablet by mouth every 6 (six) hours as needed. pain      . hydrOXYzine (VISTARIL) 50 MG capsule Take 50 mg by mouth at bedtime. For sleep/anxiety      . lisinopril (PRINIVIL,ZESTRIL) 10 MG tablet Take 10 mg by mouth daily.      . mirtazapine (REMERON) 15 MG tablet Take 30 mg by mouth at bedtime.       Marland Kitchen OLANZapine (ZYPREXA) 20 MG tablet Take 20 mg by mouth 2 (two) times daily.        Allergies as of 06/26/2012  . (No Known Allergies)    Family History  Problem Relation Age of Onset  . COPD Father   . Hyperlipidemia Mother     History   Social History  . Marital Status: Single    Spouse Name: N/A    Number of Children: 0  . Years of Education: N/A   Occupational History  . disabled    Social History Main Topics  . Smoking status: Current  Every Day Smoker -- 1.5 packs/day for 37 years    Types: Cigarettes  . Smokeless tobacco: Never Used  . Alcohol Use: No     Comment: hx heavy ETOH, quit 8 yrs ago  . Drug Use: Yes     Comment: occasional marijuana  . Sexually Active: Yes   Other Topics Concern  . Not on file   Social History Narrative   Lives w/ long-time girlfriend, Leda Min (brother's girlfriend-POA) Lives next doorHalf-Brother Babs Sciara) [phone]    Review of Systems: Gen: +Insomnia, see HPI CV: Denies chest pain, angina, palpitations, syncope, orthopnea, PND, peripheral edema, and claudication. Resp: Denies dyspnea at rest, dyspnea with exercise, cough, sputum, wheezing, coughing up blood, and pleurisy. GI: Denies vomiting blood, jaundice, and fecal incontinence.  GU : Denies urinary burning, blood in urine, urinary frequency, urinary hesitancy, nocturnal urination, and urinary incontinence. MS: Denies joint pain, limitation of movement, and swelling, stiffness, low back pain, extremity pain. Denies muscle weakness, cramps, atrophy.  Derm: Denies rash, itching, dry skin, hives, moles, warts, or unhealing ulcers.  Psych: see HPI Heme: Denies bruising, bleeding, and enlarged lymph nodes. Neuro:  Denies any headaches, dizziness, paresthesias. Endo:  Denies any problems with DM, thyroid, adrenal function.  Physical Exam: BP 104/67  Pulse 85  Temp 97.4 F (36.3 C) (Oral)  Ht 5\' 5"  (1.651 m)  Wt 160 lb 3.2 oz (72.666 kg)  BMI 26.66 kg/m2 No LMP for male patient. General:   Alert, Thin, pleasant and cooperative in NAD.  Accompanied by his significant other. Head:  Normocephalic and atraumatic. Eyes:  Sclera clear, no icterus.   Conjunctiva pink. Ears:  Normal auditory acuity. Nose:  No deformity, discharge, or lesions. Mouth:  No deformity or lesions,oropharynx pink & moist. Neck:  Supple; no masses or thyromegaly. Lungs:  Clear throughout to auscultation.   No wheezes, crackles, or  rhonchi. No acute distress. Heart:  Regular rate and rhythm; no murmurs, clicks, rubs,  or gallops. Abdomen:  Normal bowel sounds.  No bruits.  Soft, non-tender and non-distended without masses, hepatosplenomegaly or hernias noted.  No guarding or rebound tenderness.   Rectal:  Deferred. Msk:  Symmetrical without gross deformities. Normal posture. Pulses:  Normal pulses noted. Extremities:  No clubbing or edema. Neurologic:  Alert and  oriented x4;  grossly normal neurologically. Skin:  Intact without significant lesions or rashes. Lymph Nodes:  No significant cervical adenopathy. Psych:  Alert and cooperative. Normal mood and affect.

## 2012-06-26 NOTE — Progress Notes (Signed)
Faxed to PCP

## 2012-06-26 NOTE — Patient Instructions (Addendum)
You have HEPATITIS C.  It can be transmitted by needles, blood or sexual contact. Use CONDOMS. AVOID ALL STREET DRUGS! We will call you with your lab & ultrasound results 1-800-QUIT-NOW for help quitting smoking  Hepatitis C Hepatitis C is a viral infection of the liver. Infection may go undetected for months or years because symptoms may be absent or very mild. Chronic liver disease is the main danger of hepatitis C. This may lead to scarring of the liver (cirrhosis), liver failure, and liver cancer. CAUSES  Hepatitis C is caused by the hepatitis C virus (HCV). Formerly, hepatitis C infections were most commonly transmitted through blood transfusions. In the early 1990s, routine testing of donated blood for hepatitis C and exclusion of blood that tests positive for HCV began. Now, HCV is most commonly transmitted from person to person through injection drug use, sharing needles, or sex with an infected person. A caregiver may also get the infection from exposure to the blood of an infected patient by way of a cut or needle stick.  SYMPTOMS  Acute Phase Many cases of acute HCV infection are mild and cause few problems.Some people may not even realize they are sick.Symptoms in others may last a few weeks to several months and include:  Feeling very tired.  Loss of appetite.  Nausea.  Vomiting.  Abdominal pain.  Dark yellow urine.  Yellow skin and eyes (jaundice).  Itching of the skin. Chronic Phase  Between 50% to 85% of people who get HCV infection become "chronic carriers." They often have no symptoms, but the virus stays in their body.They may spread the virus to others and can get long-term liver disease.  Many people with chronic HCV infection remain healthy for many years. However, up to 1 in 5 chronically infected people may develop severe liver diseases including scarring of the liver (cirrhosis), liver failure, or liver cancer. DIAGNOSIS  Diagnosis of hepatitis C  infection is made by testing blood for the presence of hepatitis C viral particles called RNA. Other tests may also be done to measure the status of current liver function, exclude other liver problems, or assess liver damage. TREATMENT  Treatment with many antiviral drugs is available and recommended for some patients with chronic HCV infection. Drug treatment is generally considered appropriate for patients who:  Are 49 years of age or older.  Have a positive test for HCV particles in the blood.  Have a liver tissue sample (biopsy) that shows chronic hepatitis and significant scarring (fibrosis).  Do not have signs of liver failure.  Have acceptable blood test results that confirm the wellness of other body organs.  Are willing to be treated and conform to treatment requirements.  Have no other circumstances that would prevent treatment from being recommended (contraindications). All people who are offered and choose to receive drug treatment must understand that careful medical follow up for many months and even years is crucial in order to make successful care possible. The goal of drug treatment is to eliminate any evidence of HCV in the blood on a long-term basis. This is called a "sustained virologic response" or SVR. Achieving a SVR is associated with a decrease in the chance of life-threatening liver problems, need for a liver transplant, liver cancer rates, and liver-related complications. Successful treatment currently requires taking treatment drugs for at least 24 weeks and up to 72 weeks. An injected drug (interferon) given weekly and an oral antiviral medicine taken daily are usually prescribed. Side effects from these drugs  are common and some may be very serious. Your response to treatment must be carefully monitored by both you and your caregiver throughout the entire treatment period. PREVENTION There is no vaccine for hepatitis C. The only way to prevent the disease is to  reduce the risk of exposure to the virus.   Avoid sharing drug needles or personal items like toothbrushes, razors, and nail clippers with an infected person.  Healthcare workers need to avoid injuries and wear appropriate protective equipment such as gloves, gowns, and face masks when performing invasive medical or nursing procedures. HOME CARE INSTRUCTIONS  To avoid making your liver disease worse:  Strictly avoid drinking alcohol.  Carefully review all new prescriptions of medicines with your caregiver. Ask your caregiver which drugs you should avoid. The following drugs are toxic to the liver, and your caregiver may tell you to avoid them:  Isoniazid.  Methyldopa.  Acetaminophen.  Anabolic steroids (muscle-building drugs).  Erythromycin.  Oral contraceptives (birth control pills).  Check with your caregiver to make sure medicine you are currently taking will not be harmful.  Periodic blood tests may be required. Follow your caregiver's advice about when you should have blood tests.  Avoid a sexual relationship until advised otherwise by your caregiver.  Avoid activities that could expose other people to your blood. Examples include sharing a toothbrush, nail clippers, razors, and needles.  Bed rest is not necessary, but it may make you feel better. Recovery time is not related to the amount of rest you receive.  This infection is contagious. Follow your caregiver's instructions in order to avoid spread of the infection. SEEK IMMEDIATE MEDICAL CARE IF:  You have increasing fatigue or weakness.  You have an oral temperature above 102 F (38.9 C), not controlled by medicine.  You develop loss of appetite, nausea, or vomiting.  You develop jaundice.  You develop easy bruising or bleeding.  You develop any severe problems as a result of your treatment. MAKE SURE YOU:   Understand these instructions.  Will watch your condition.  Will get help right away if you are  not doing well or get worse. Document Released: 05/06/2000 Document Revised: 08/01/2011 Document Reviewed: 09/08/2010 [facility] Patient Information 2013 [place], [place].    Safer Sex Your caregiver wants you to have this information about the infections that can be transmitted from sexual contact and how to prevent them. The idea behind safer sex is that you can be sexually active, and at the same time reduce the risk of giving or getting a sexually transmitted disease (STD). Every person should be aware of how to prevent him or herself and his or her sex partner from getting an STD. CAUSES OF STDS STDs are transmitted by sharing body fluids, which contain viruses and bacteria. The following fluids all transmit infections during sexual intercourse and sex acts:  Semen.  Saliva.  Urine.  Blood.  Vaginal mucus. Examples of STDs include:  Chlamydia.  Gonorrhea.  Genital herpes.  Hepatitis B.  Human immunodeficiency virus or acquired immunodeficiency syndrome (HIV or AIDS).  Syphilis.  Trichomonas.  Pubic lice.  Human papillomavirus (HPV), which may include:  Genital warts.  Cervical dysplasia.  Cervical cancer (can develop with certain types of HPV). SYMPTOMS  Sexual diseases often cause few or no symptoms until they are advanced, so a person can be infected and spread the infection without knowing it. Some STDs respond to treatment very well. Others, like HIV and herpes, cannot be cured, but are treated to reduce  their effects. Specific symptoms include:  Abnormal vaginal discharge.  Irritation or itching in and around the vagina, and in the pubic hair.  Pain during sexual intercourse.  Bleeding during sexual intercourse.  Pelvic or abdominal pain.  Fever.  Growths in and around the vagina.  An ulcer in or around the vagina.  Swollen glands in the groin area. DIAGNOSIS   Blood tests.  Pap test.  Culture test of abnormal vaginal discharge.  A test  that applies a solution and examines the cervix with a lighted magnifying scope (colposcopy).  A test that examines the pelvis with a lighted tube, through a small incision (laparoscopy). TREATMENT  The treatment will depend on the cause of the STD.  Antibiotic treatment by injection, oral, creams, or suppositories in the vagina.  Over-the-counter medicated shampoo, to get rid of pubic lice.  Removing or treating growths with medicine, freezing, burning (electrocautery), or surgery.  Surgery treatment for HPV of the cervix.  Supportive medicines for herpes, HIV, AIDS, and hepatitis. Being careful cannot eliminate all risk of infection, but sex can be made much safer. Safe sexual practices include body massage and gentle touching. Masturbation is safe, as long as body fluids do not contact skin that has sores or cuts. Dry kissing and oral sex on a man wearing a latex condom or on a woman wearing a male condom is also safe. Slightly less safe is intercourse while the man wears a latex condom or wet kissing. It is also safer to have one sex partner that you know is not having sex with anyone else. LENGTH OF ILLNESS An STD might be treated and cured in a week, sometimes a month, or more. And it can linger with symptoms for many years. STDs can also cause damage to the male organs. This can cause chronic pain, infertility, and recurrence of the STD, especially herpes, hepatitis, HIV, and HPV. HOME CARE INSTRUCTIONS AND PREVENTION  Alcohol and recreational drugs are often the reason given for not practicing safer sex. These substances affect your judgment. Alcohol and recreational drugs can also impair your immune system, making you more vulnerable to disease.  Do not engage in risky and dangerous sexual practices, including:  Vaginal or anal sex without a condom.  Oral sex on a man without a condom.  Oral sex on a woman without a male condom.  Using saliva to lubricate a  condom.  Any other sexual contact in which body fluids or blood from one partner contact the other partner.  You should use only latex condoms for men and water soluble lubricants. Petroleum based lubricants or oils used to lubricate a condom will weaken the condom and increase the chance that it will break.  Think very carefully before having sex with anyone who is high risk for STDs and HIV. This includes IV drug users, people with multiple sexual partners, or people who have had an STD, or a positive hepatitis or HIV blood test.  Remember that even if your partner has had only one previous partner, their previous partner might have had multiple partners. If so, you are at high risk of being exposed to an STD. You and your sex partner should be the only sex partners with each other, with no one else involved.  A vaccine is available for hepatitis B and HPV through your caregiver or the Public Health Department. Everyone should be vaccinated with these vaccines.  Avoid risky sex practices. Sex acts that can break the skin make you more  likely to get an STD. SEEK MEDICAL CARE IF:   If you think you have an STD, even if you do not have any symptoms. Contact your caregiver for evaluation and treatment, if needed.  You think or know your sex partner has acquired an STD.  You have any of the symptoms mentioned above. Document Released: 06/16/2004 Document Revised: 08/01/2011 Document Reviewed: 04/08/2009 [facility] Patient Information 2013 [place], [place].

## 2012-06-26 NOTE — Assessment & Plan Note (Addendum)
Joseph [name] is a pleasant 47 y.o. male with gentoype 1a chronic hepatitis C.  He has hx of polysubstance abuse (States he quit drinking ETOH recently), schizophrenia, & depression which could complicate treatment compliance.  I would recommend an appt with Hepatitis C liver clinic in [place] for consideration of any available treatment options & possibly arrange polysubstance abuse counseling.  CBC, LFTS, INR, AFP RUQ ultrasound Urged cessation of all street drugs. Applauded for quitting drinking. Hepatitis C literature, safe sex precautions, transmission, disease discussed, questions answered. Once review of all labs & ultrasound completed, we will initiate referral to [facility]

## 2012-06-27 LAB — HEPATIC FUNCTION PANEL
ALT: 20 U/L (ref 0–53)
Bilirubin, Direct: 0.2 mg/dL (ref 0.0–0.3)
Indirect Bilirubin: 0.3 mg/dL (ref 0.0–0.9)

## 2012-06-27 LAB — CBC
Hemoglobin: 13.2 g/dL (ref 13.0–17.0)
MCH: 25.5 pg — ABNORMAL LOW (ref 26.0–34.0)
RBC: 5.17 MIL/uL (ref 4.22–5.81)

## 2012-06-27 LAB — DRUG SCREEN, URINE
Cocaine Metabolites: NEGATIVE
Creatinine,U: 116.32 mg/dL
Opiates: POSITIVE — AB
Phencyclidine (PCP): NEGATIVE

## 2012-06-27 LAB — ETHANOL: Alcohol, Ethyl (B): <10 mg/dL (ref 0–10)

## 2012-06-27 LAB — PROTIME-INR: Prothrombin Time: 13.5 seconds (ref 11.6–15.2)

## 2012-06-27 NOTE — Progress Notes (Signed)
Results faxed to PCP 

## 2012-06-27 NOTE — Progress Notes (Signed)
Quick Note:  Await [place] Cc:Margorie John, MD  ______

## 2012-06-28 ENCOUNTER — Ambulatory Visit (HOSPITAL_COMMUNITY)
Admission: RE | Admit: 2012-06-28 | Discharge: 2012-06-28 | Disposition: A | Discharge status: Home / Self Care | Payer: Medicaid Other | Source: Ambulatory Visit | Attending: Urgent Care | Admitting: Urgent Care

## 2012-06-28 DIAGNOSIS — B1920 Unspecified viral hepatitis C without hepatic coma: Secondary | ICD-10-CM | POA: Insufficient documentation

## 2012-06-28 DIAGNOSIS — R748 Abnormal levels of other serum enzymes: Secondary | ICD-10-CM | POA: Insufficient documentation

## 2012-06-29 ENCOUNTER — Telehealth: Payer: Self-pay | Admitting: Urgent Care

## 2012-06-29 NOTE — Progress Notes (Signed)
Quick Note:  Discussed w/ pt. See phone note Cc:  Hep C clinic CC: Margorie John, MD  ______

## 2012-06-29 NOTE — Progress Notes (Signed)
Discussed w/ Dr Jena Gauss who suggests the patient may be a candidate for new regimen down at [facility].  He "suggests we plan to send the patient down to the hepatitis C clinic later in the year, perhaps in the Fall to get their opinion". Discussed plan, labs, [place] with pt who agrees. Soledad Gerlach, please send notes for referral to Hepatitis C clinic appt Fall 2014 Thanks

## 2012-06-29 NOTE — Progress Notes (Signed)
Quick Note:  Results given to pt. [id number], Shelton Silvas, MD CC: [place] [facility] ______

## 2012-06-29 NOTE — Progress Notes (Signed)
Results faxed to PCP and Hep C clinic 

## 2012-06-29 NOTE — Progress Notes (Signed)
Results faxed to PCP and Hep C clinic

## 2012-07-02 NOTE — Progress Notes (Signed)
Referral has been faxed to [place] [facility] and they will contact Joseph [name] and schedule date & time

## 2012-07-03 NOTE — Telephone Encounter (Signed)
A user error has taken place: encounter opened in error, closed for administrative reasons.

## 2012-08-29 ENCOUNTER — Inpatient Hospital Stay: Payer: Self-pay | Admitting: Psychiatry

## 2012-08-30 LAB — LIPID PANEL
Cholesterol: 169 mg/dL (ref 0–200)
HDL Cholesterol: 33 mg/dL — ABNORMAL LOW (ref 40–60)
Ldl Cholesterol, Calc: 83 mg/dL (ref 0–100)

## 2012-08-30 LAB — URINALYSIS, COMPLETE
Ketone: NEGATIVE
Leukocyte Esterase: NEGATIVE
Nitrite: NEGATIVE
Ph: 7 (ref 4.5–8.0)
Protein: NEGATIVE
RBC,UR: NONE SEEN /HPF (ref 0–5)
Squamous Epithelial: <1
Transitional Epi: <1

## 2012-08-30 LAB — BEHAVIORAL MEDICINE 1 PANEL
Albumin: 3.8 g/dL (ref 3.4–5.0)
Alkaline Phosphatase: 161 U/L — ABNORMAL HIGH (ref 50–136)
Anion Gap: 6 — ABNORMAL LOW (ref 7–16)
Basophil %: 1.3 %
Co2: 26 mmol/L (ref 21–32)
Eosinophil #: 0.5 10*3/uL (ref 0.0–0.7)
Eosinophil %: 8 %
Glucose: 89 mg/dL (ref 65–99)
HCT: 43.7 % (ref 40.0–52.0)
HGB: 14.5 g/dL (ref 13.0–18.0)
Lymphocyte %: 31.5 %
MCH: 25.2 pg — ABNORMAL LOW (ref 26.0–34.0)
MCHC: 33.1 g/dL (ref 32.0–36.0)
Monocyte %: 9.6 %
Neutrophil %: 49.6 %
Potassium: 4.4 mmol/L (ref 3.5–5.1)
RDW: 16.5 % — ABNORMAL HIGH (ref 11.5–14.5)
SGOT(AST): 30 U/L (ref 15–37)
Sodium: 134 mmol/L — ABNORMAL LOW (ref 136–145)
Thyroid Stimulating Horm: 2.28 u[IU]/mL

## 2012-09-01 LAB — CREATININE, SERUM
Creatinine: 1.86 mg/dL — ABNORMAL HIGH (ref 0.60–1.30)
EGFR (Non-African Amer.): 42 — ABNORMAL LOW

## 2012-10-21 DEATH — deceased

## 2012-10-23 NOTE — Progress Notes (Signed)
REVIEWED.  

## 2013-04-11 IMAGING — CT CT CHEST W/O CM
1 series · 15 of 32 positions shown, 19 images · non-contrast
Comparison: none

REASON FOR EXAM: Plain chest x-ray shows a possible pneumothorax with a
recommendation of [place] Jumper
COMMENTS:

[Series 2: soft tissue · axial · 0.71mm/px · z∈[-802,-505]mm · 15 of 111 slices shown, 19 images]
[im 8/111  soft-tissue]
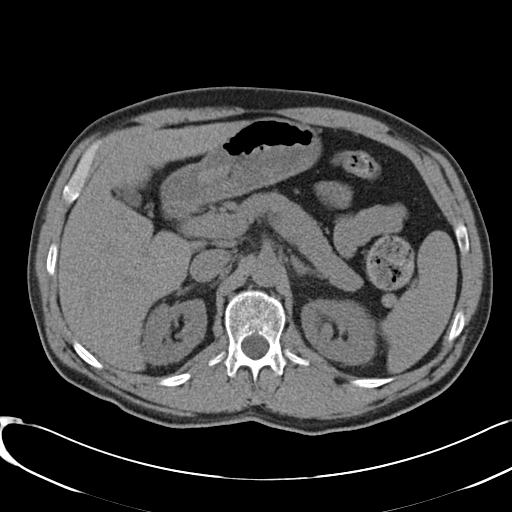
[im 8/111  bone]
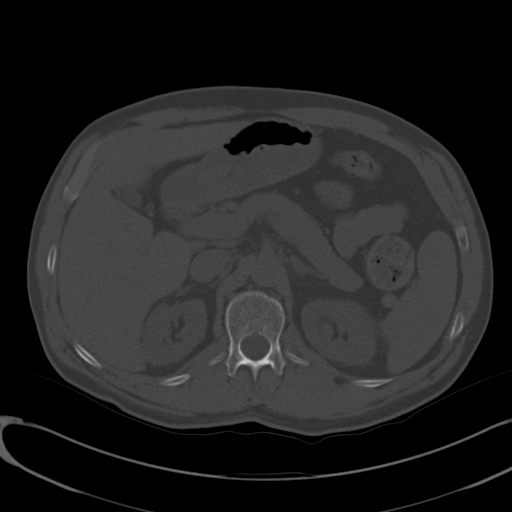
[im 15/111  soft-tissue]
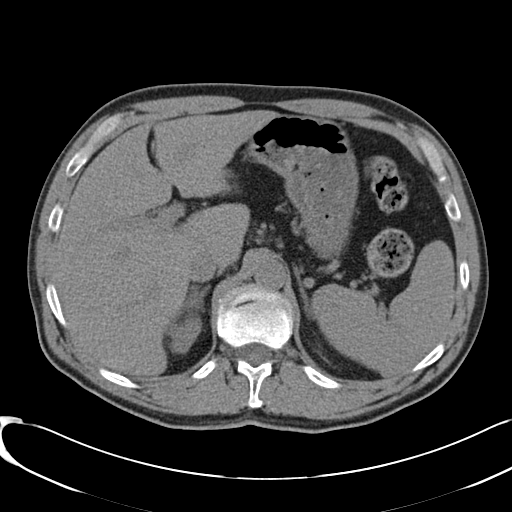
[im 22/111  soft-tissue]
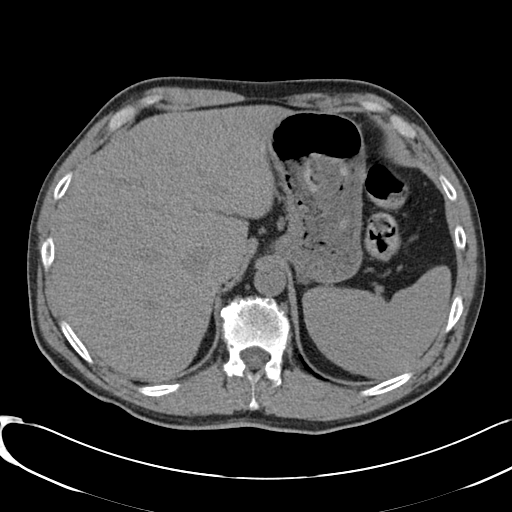
[im 32/111  soft-tissue]
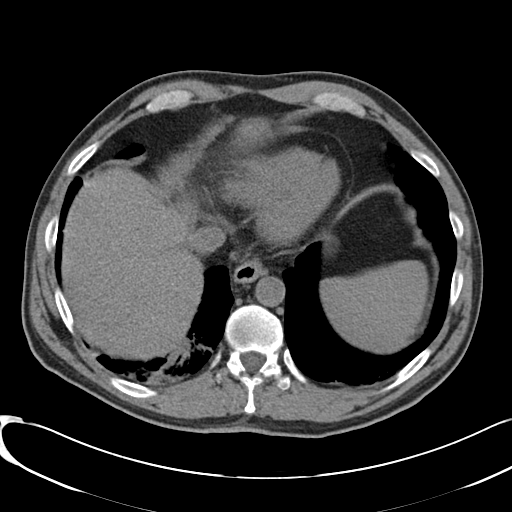
[im 40/111  soft-tissue]
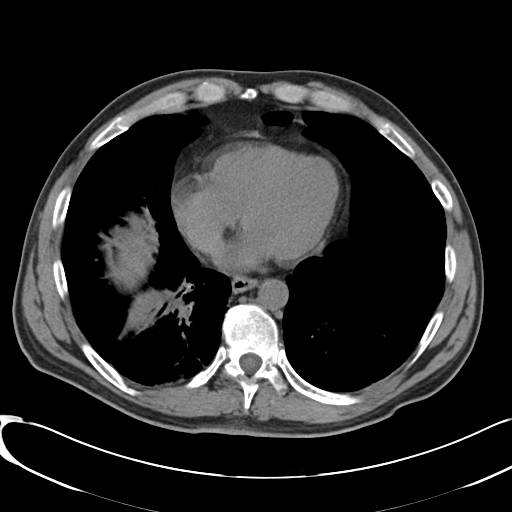
[im 47/111  soft-tissue]
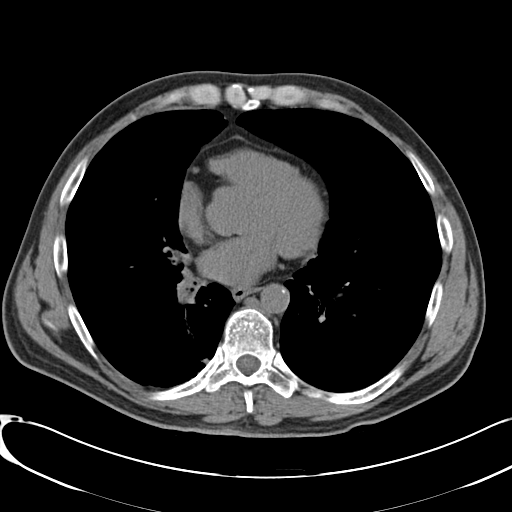
[im 57/111  soft-tissue]
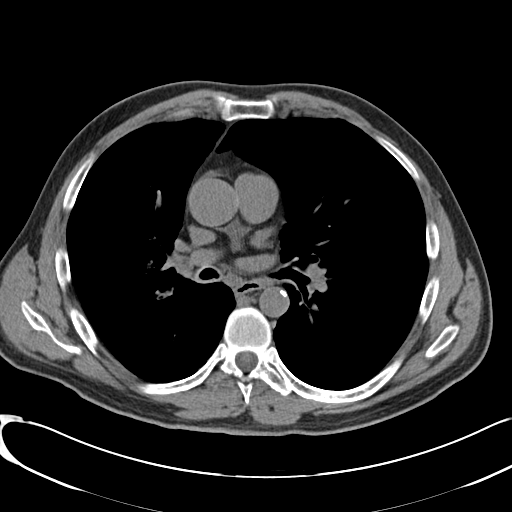
[im 64/111  soft-tissue]
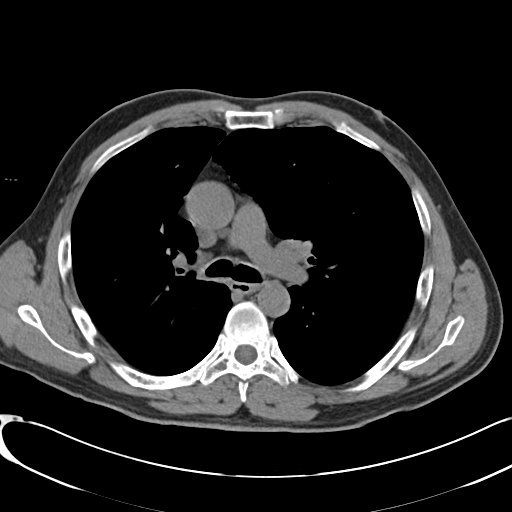
[im 71/111  soft-tissue]
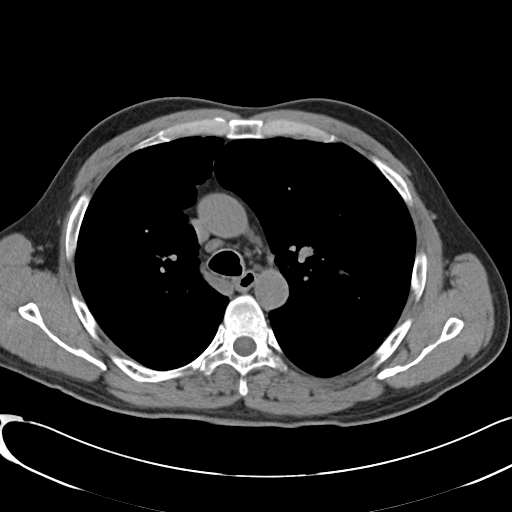
[im 71/111  bone]
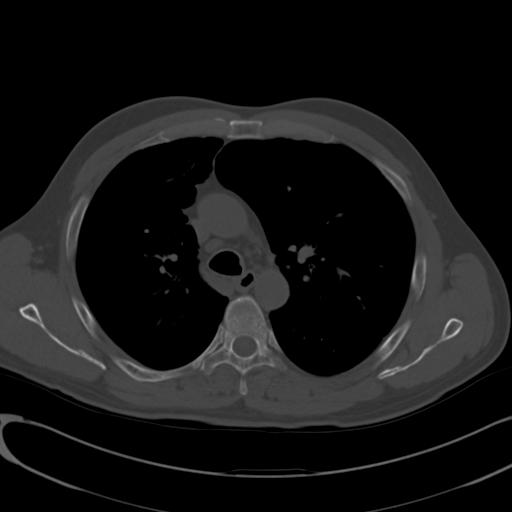
[im 79/111  soft-tissue]
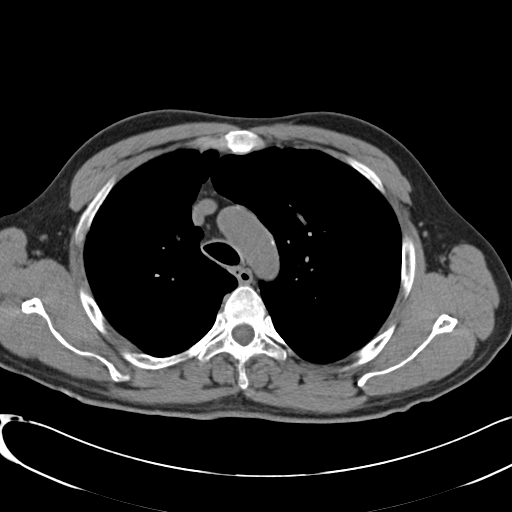
[im 89/111  soft-tissue]
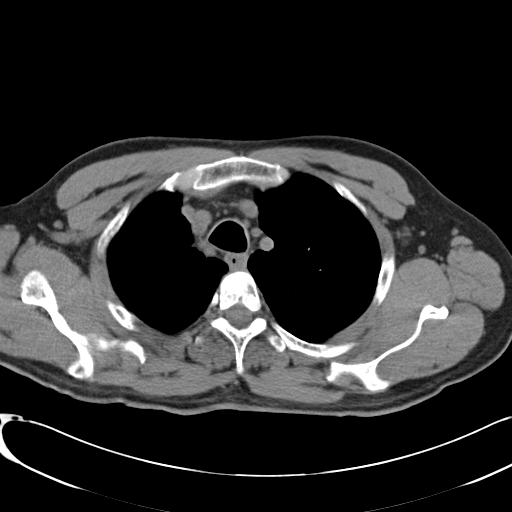
[im 96/111  soft-tissue]
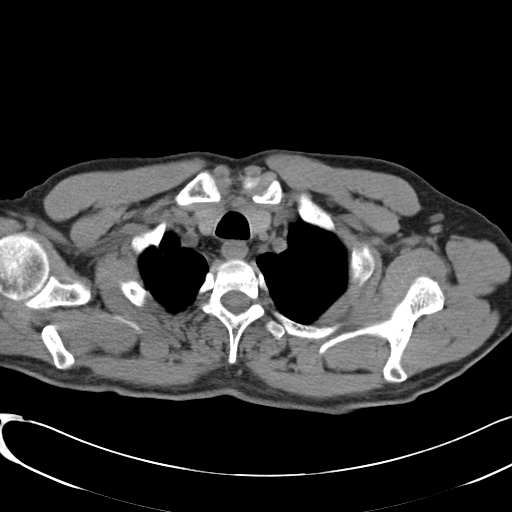
[im 96/111  lung]
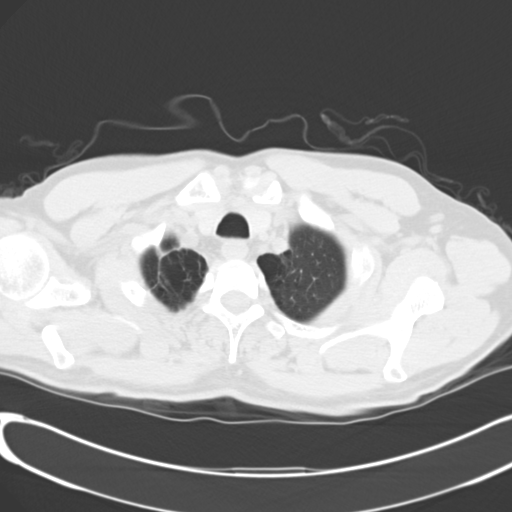
[im 100/111  lung]
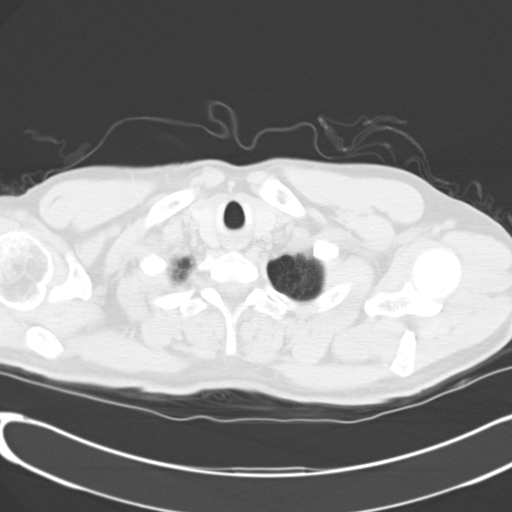
[im 103/111  soft-tissue]
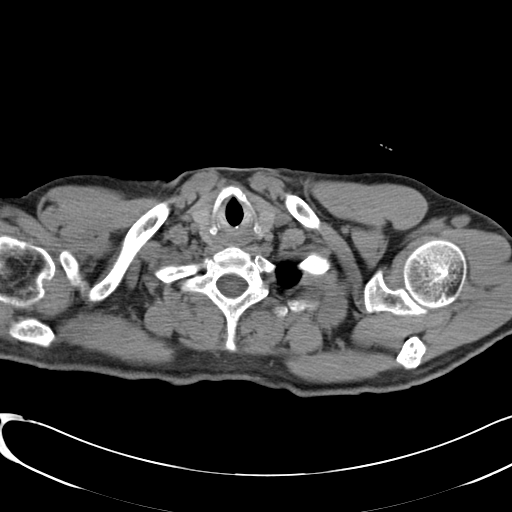
[im 103/111  lung]
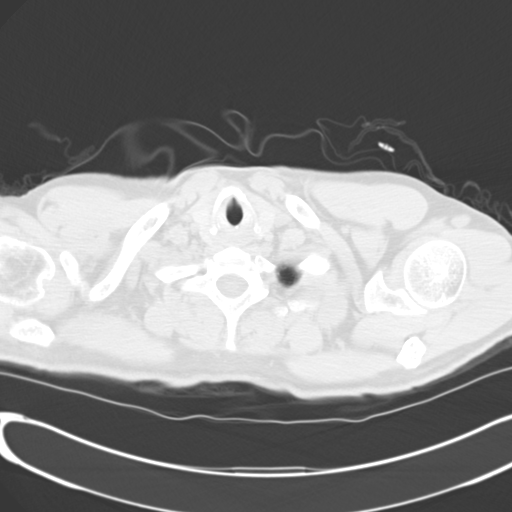
[im 107/111  lung]
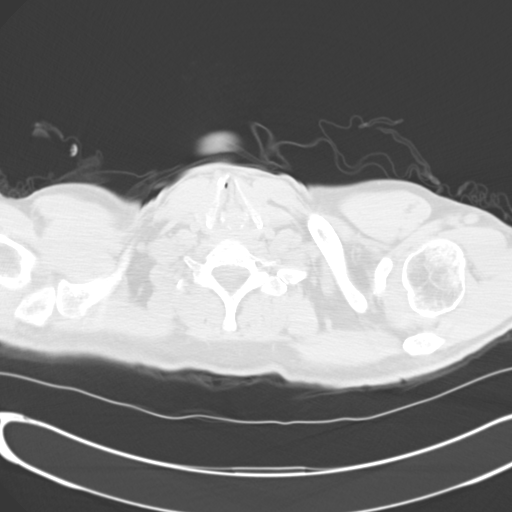

[15 of 32 positions shown; findings below may reference images not displayed]

PROCEDURE:     CT  - CT CHEST WITHOUT CONTRAST  - August 30, 2012  [DATE]

RESULT:     Axial noncontrast CT scanning was performed through the abdomen
and pelvis with reconstructions at 3 mm intervals and slice thicknesses.

At lung window settings there are emphysematous changes bilaterally. There
are bullous lesions in the apices. In the right lower lobe posteriorly there
is a coarse infiltrate. There is minimal subsegmental atelectasis at the
left lung base and in the lingula. There is an abnormal pulmonary nodule in
the right upper lobe at the level of the carina which measures 5 mm in
diameter. There are other scattered 2 to 3 mm diameter nodules in both lungs
which will merit followup.

At mediastinal window settings the cardiac chambers are normal in size.
There are coronary artery calcifications.  The caliber of the thoracic aorta
is normal. There is a borderline enlarged lymph node anterior to the trachea
measuring 1.8 cm in diameter just above the carina. I do not see bulky hilar
lymph nodes. There is no pleural or pericardial effusion.

Within the upper abdomen the observed portions of the liver and spleen are
normal. There are no adrenal masses. There is an accessory spleen measuring
approximately is 9 mm in diameter. The thoracic vertebral bodies are
preserved in height.
IMPRESSION: 1. There is no evidence of a pneumothorax.
2. There are emphysematous changes in both lungs with bullous lesions in the
apices.
3. There is atelectasis versus interstitial pneumonia in the right lower
lobe posteriorly. There is patchy density posteriorly in the costophrenic
gutter on the left and in the lingula.
3. There are scattered noncalcified nodules measuring 5 mm in diameter or
less in both lungs. These merit followup at 6 month intervals for the next 2
years to assure stability.

[REDACTED]

## 2013-04-11 IMAGING — CR DG CHEST 2V
1 series · 2 of 2 positions shown · non-contrast
Comparison: none

REASON FOR EXAM: copd, coughing, more SOB
COMMENTS:

PROCEDURE:     DXR - DXR CHEST PA (OR AP) AND LATERAL  - August 30, 2012  [DATE]
RESULT:     History: History: Cough.
Comparison Study: No prior.

[Series 1: w chest pa · 0.14mm/px · 2 of 2 slices shown]
[im 1/2]
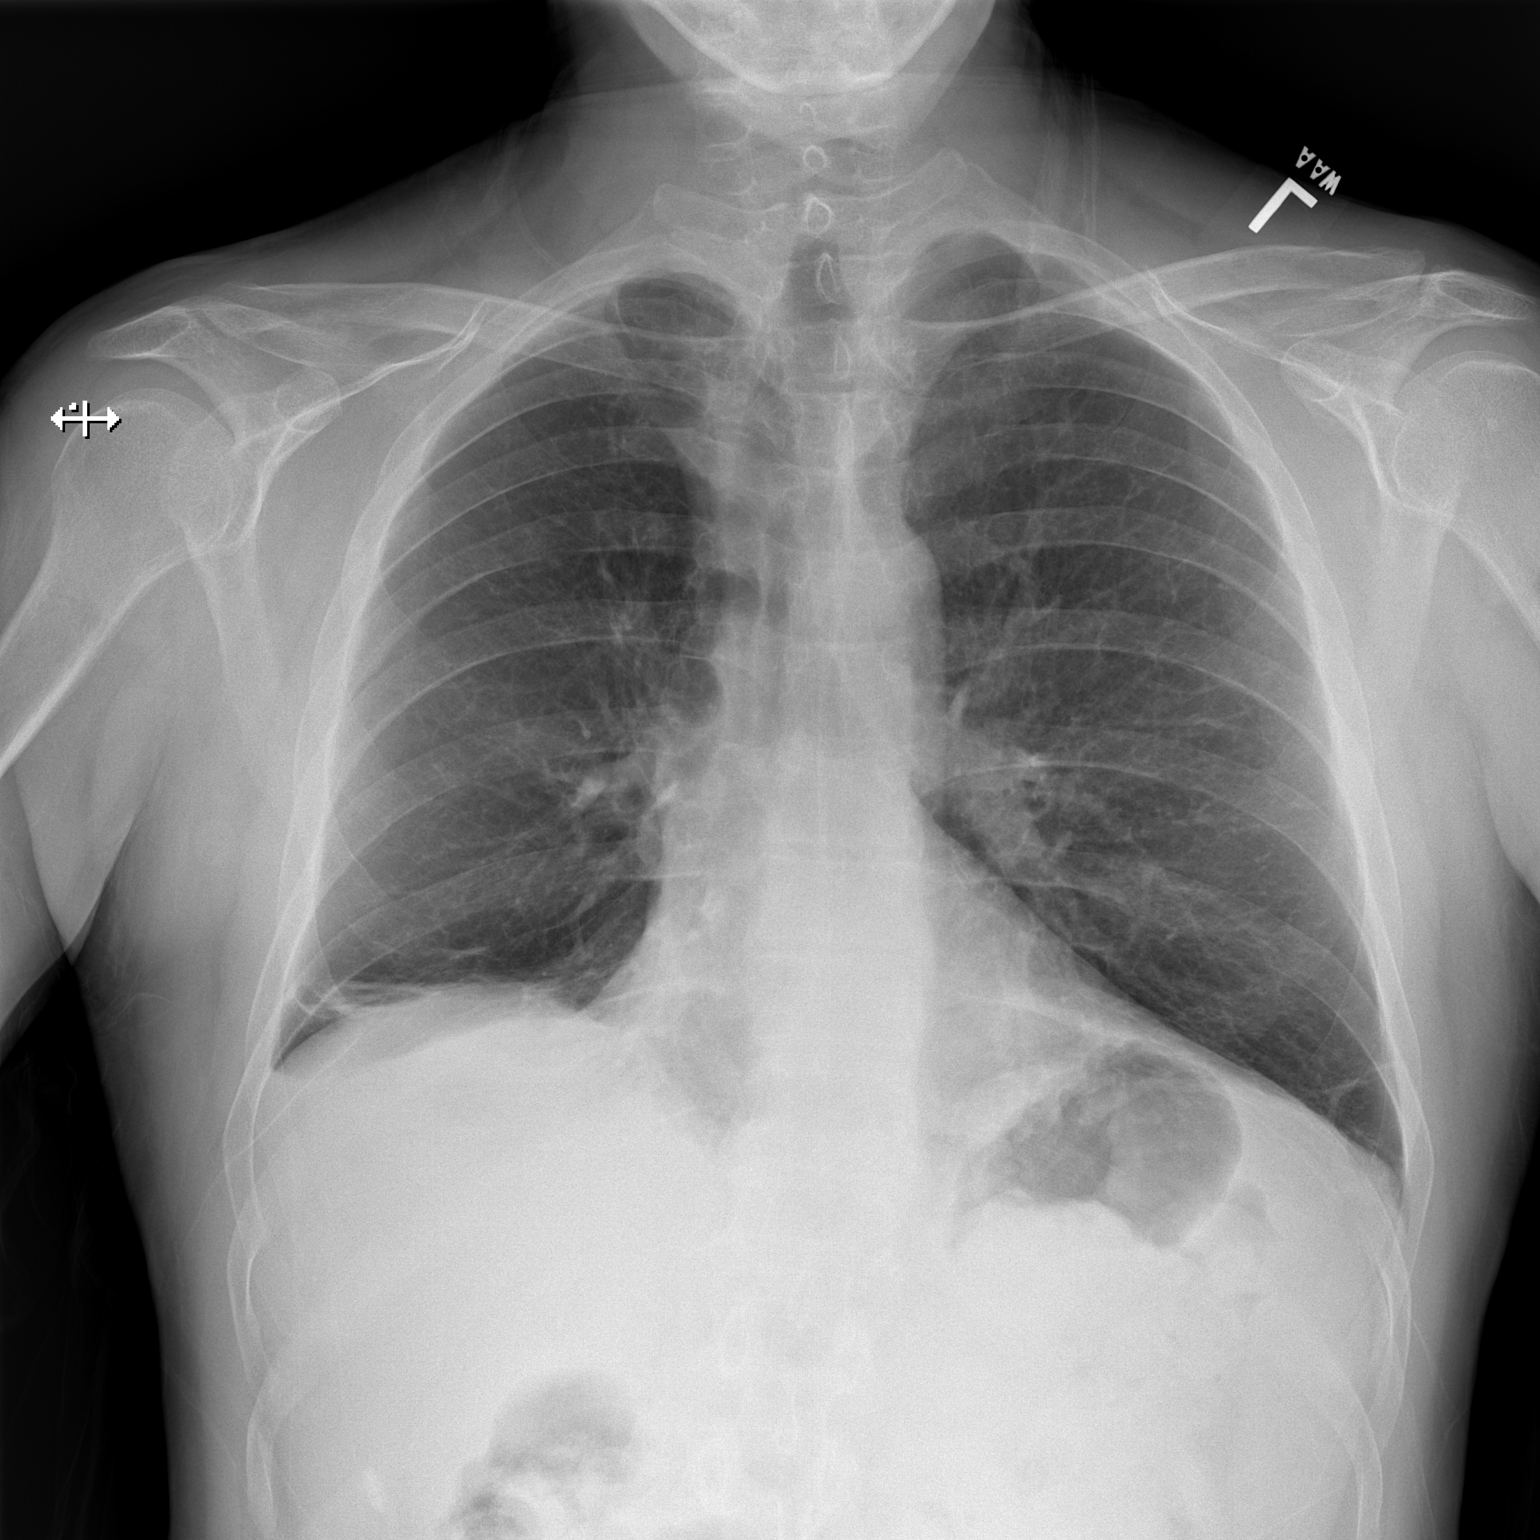
[im 2/2]
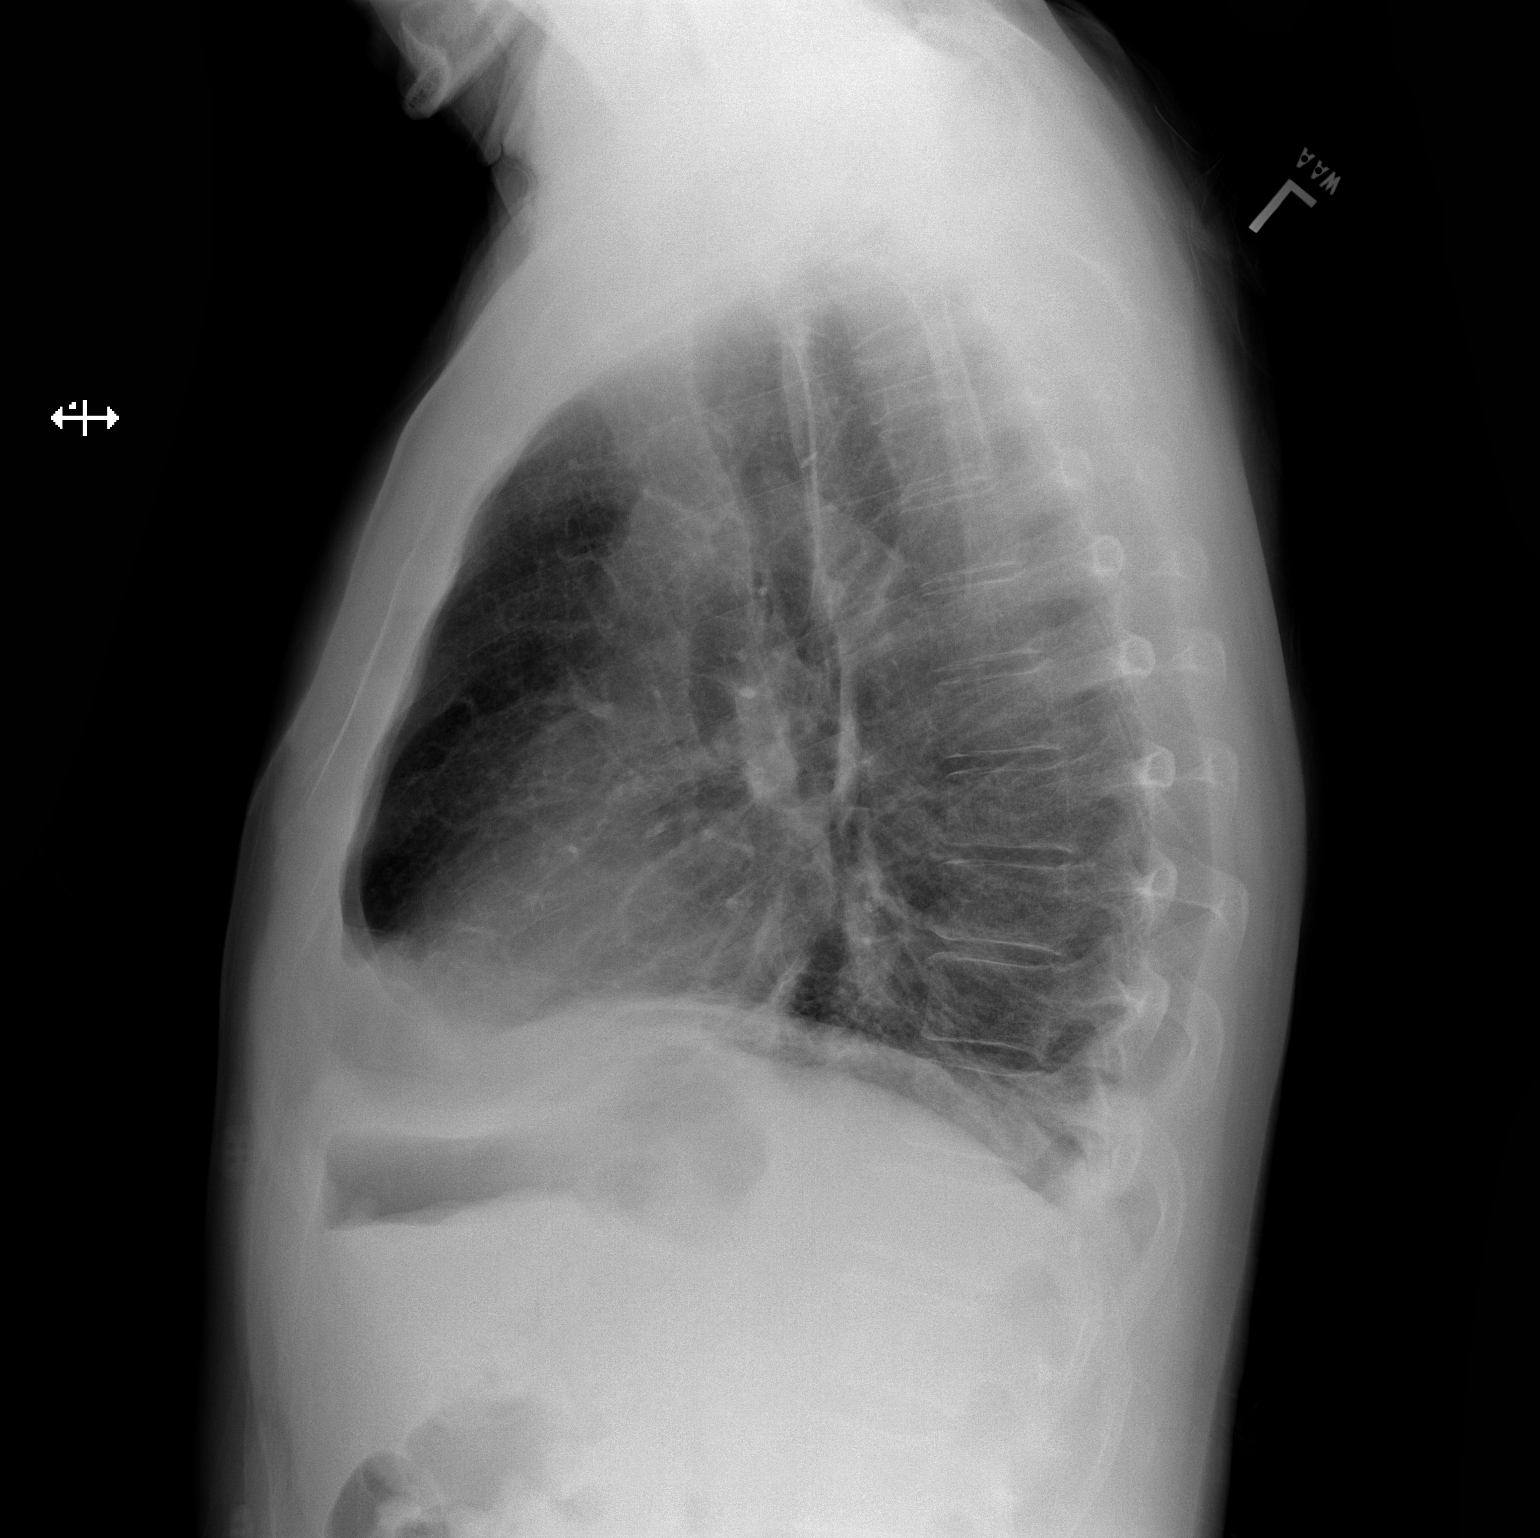

[2 of 2 positions shown; findings below may reference images not displayed]

FINDINGS: Mild atelectasis both lung bases. A small right base pneumothorax
cannot be completely. Chest CT can be obtained for further evaluation. Heart
size is normal. Small right pleural effusion may be present. Report phoned
to patient's physician at time of study.
IMPRESSION: Mild atelectasis both lung bases. A small pneumothorax
presenting under the a right lung cannot be completely excluded and chest CT
can be obtained for further evaluation.

## 2014-09-12 NOTE — H&P (Signed)
PATIENT NAME:  Joseph, [name] MR#:  [id number] DATE OF BIRTH:  February 05, 1966  DATE OF ADMISSION:  08/29/2012  IDENTIFYING INFORMATION AND CHIEF COMPLAINT:  This is a 49 year old man who is a patient of mine in the outpatient clinic who has schizophrenia.   CHIEF COMPLAINT:  "The voices have gotten worse."   HISTORY OF PRESENT ILLNESS:  The patient came to my office yesterday telling me that his auditory hallucinations have become much worse over the past month.  They are going on almost constantly.  He can hear the loud voices and can tell what they are saying to him.  They have been telling him negative things and at times telling him that he should hurt himself.  His mood has been anxious and more depressed about this.  He has been sleeping more fitfully.  Does not report any other new physical symptoms.  Does not report that he actually wants to kill himself or has done anything to harm himself.  Denies homicidal ideation.  He says he has been compliant with his medication and has not been abusing any drugs or alcohol.  I had seen him just about a week ago in the clinic when he had complained that he was hearing a buzzing noise.  At that time I had thought that it could be due to the Adderall that he was buying off the street and taking.  I gave him some education about how stimulants could cause hallucinations and asked him to stop it.  Apparently, he has been off it for the last week and his auditory hallucinations have just gotten worse.   PAST PSYCHIATRIC HISTORY:  The patient has a long history probably going back over 20 years of psychotic symptoms.  He has been getting treatment for about the past decade.  In the past had been on several medicines, but for some years now has been stable on a combination of Zyprexa along with Remeron and citalopram and clonazepam.  He has been hospitalized before, but it has been several years.  Denies a history of actual suicide attempts.  Does have a past history of  some aggression, but not serious violence and it has been years since then.  He has a past history of substance abuse, but says he has been sober for at least 6 or more years.   SOCIAL HISTORY:  The patient lives with his girlfriend.  His brother lives close by as well.  He had previously lived in [place], but moved down here to be a little closer to his brother.  The patient is on disability.  Seems to have a pretty limited life around the house.   PAST MEDICAL HISTORY:  Probably has a history of hypertension.  Denies having myocardial infarctions or strokes or cancer.  Also, has a history of chronic pain secondary to traumatic orthopedic injuries in the past.  Takes a small dose of the standing Percocet for it.  The patient is on home oxygen at 2 liters chronically, presumably because of a history of COPD.  SUBSTANCE ABUSE HISTORY:  States that he used to drink heavily in the past, but stopped several years ago.  Some past history of drug abuse as well.  There has not been any indication of abuse of opiates or benzodiazepines since I have been working with him.   REVIEW OF SYSTEMS:  Chief complaint is the auditory hallucinations which have become much worse.  Loud voices much of the time.  Mood is worse.  Sleep is more disrupted.  No suicidal intent or plan.  No new physical symptoms.   MENTAL STATUS EXAMINATION:  Slightly disheveled man who looks older than his stated age.  Cooperative with the interview.  Good eye contact.  Normal psychomotor activity.  Speech is quiet, slow and shows thought blocking.  He tends to answer in very short simple sentences.  His thoughts do not show obvious signs of gross delusional thinking.  He does endorse auditory hallucinations.  He denied suicidal intent or plan, but has had command hallucinations recently.  Short and long-term memory intact.  Intelligence average.   PHYSICAL EXAMINATION: GENERAL:  The patient does not appear to be in acute distress.  SKIN:  No  skin lesions identified.  HEENT:  Pupils equal and reactive.  Face symmetric.  Oral mucosa normal.  Full range of motion at all extremities.  Strength and reflexes normal and symmetric throughout.  Cranial nerves symmetric and normal.  Normal gait.  Nontender back and neck.  LUNGS:  Clear without wheezes.  HEART:  Regular rate and rhythm.  ABDOMEN:  Soft, nontender, normal bowel sounds.  VITAL SIGNS:  Temperature is 98.1, pulse 71, respirations 18, blood pressure 125/90.    CURRENT MEDICATIONS:  Zyprexa 40 mg at night, baclofen 10 mg 3 times a day, Percocet 5 mg 1 q. 4 hours as needed pain, Thorazine 100 mg at night, Celexa 40 mg per day, clonazepam 1 mg 3 times a day, Neurontin 300 mg 4 times a day, Remeron 30 mg at night.   ALLERGIES:  No known drug allergies.   ASSESSMENT:  This is a 49 year old man with schizophrenia, presents with worsening auditory hallucinations.  It is not clear why things have gotten worse for him.  The patient is not presenting as aggressive or violent, but is very disturbed by his hallucinations which have gotten worse and have been accompanied by command suicidal statements.  Hospitalization for stabilization of medicine.   TREATMENT PLAN:  The patient is admitted to psychiatry.  Increase the Zyprexa dose to 60 mg at night.  Work with him in daily individual and group therapy.  Monitor change in symptoms.  Get collateral history from the family.  We may need to change antipsychotics even further if he does not respond well.   DIAGNOSIS, PRINCIPAL AND PRIMARY:  AXIS I:  Schizophrenia, undifferentiated.   SECONDARY DIAGNOSES: AXIS I:  Alcohol dependence, in sustained remission.  AXIS II:  No diagnosis.  AXIS III:  Chronic obstructive pulmonary disease, chronic pain, history of hypertension.  AXIS IV:  Moderate from chronic burden of illness.  AXIS V:  Functioning at time of admission 30.     ____________________________ Audery AmelJohn T. Clapacs, MD jtc:ea D: 08/29/2012  22:47:44 ET T: 08/29/2012 23:02:57 ET JOB#: [id number]  cc: Audery AmelJohn T. Clapacs, MD, <Dictator> Audery AmelJOHN T CLAPACS MD ELECTRONICALLY SIGNED 09/02/2012 11:27

## 2014-09-12 NOTE — Consult Note (Signed)
PATIENT NAME:  Joseph [name], Joseph [name] DATE OF BIRTH:  December 08, 1965  DATE OF CONSULTATION:  08/31/2012  REFERRING PHYSICIAN:  Dr. Toni Amendlapacs CONSULTING PHYSICIAN:  Mohini Heathcock S. Sherryll BurgerShah, MD  PRIMARY CARE PHYSICIAN:  None.   REASON FOR CONSULTATION:  Medical management for chronic obstructive pulmonary disease.   HISTORY OF PRESENT ILLNESS:  The patient is a 49 year old male with a known history of hypertension, COPD, was admitted under psychiatry care for schizophrenia.  We are being consulted for COPD.   The patient has been smoking extensively about 1 pack a day for the last 13 years, was diagnosed with COPD, emphysema for about a year at one of the Emergency Departments and was prescribed oxygen to be used continuously 2 liters.  He does not recall using any other medications at this time.  He has been having a lot of cough with yellowish mucus for the last 2 to 3 days.  Denies any fever.  Also has some associated shortness of breath and underwent chest x-ray which showed possible small pneumothorax for which he underwent CT scan of the chest and showed extensive emphysema.  No pneumothorax and interstitial pneumonia in the right lower lobe.  We are being consulted for further evaluation and management.  He also has a 5 mm nodule in both lungs.  The patient denies any other symptoms at this time.   PAST MEDICAL HISTORY:  1.  Hypertension.  2.  Chronic obstructive pulmonary disease.  3.  Chronic pain.  4.  History of schizophrenia.  SOCIAL HISTORY:  He smokes 1 pack of cigarettes daily for the last 13 years.  He is a heavy drinker in the past, but has stopped several years ago.  He also has a remote history of drug abuse, has not done it for quite a while.   FAMILY HISTORY:  Father died of heart attack.   REVIEW OF SYSTEMS:   CONSTITUTIONAL:  No fever, fatigue, weakness.  EYES:  No blurred or double vision.  EARS, NOSE, THROAT:  No tinnitus or ear pain.  RESPIRATORY:  Positive for cough with  yellow sputum.  No chest pain.  Positive for shortness of breath.  CARDIOVASCULAR:  No chest, orthopnea, edema.  GASTROINTESTINAL:  No nausea, vomiting, diarrhea.  GENITOURINARY:  No dysuria, hematuria.  ENDOCRINE:  No polyuria, nocturia.  HEMATOLOGY:  No anemia, easy bruising.  SKIN:  No rash or lesion.  MUSCULOSKELETAL:  No arthritis or muscle cramp.  NEUROLOGIC:  No tingling, numbness, or weakness.  PSYCHIATRIC:  Positive for schizophrenia.   PHYSICAL EXAMINATION: VITAL SIGNS:  Temperature 98.1, heart rate 71 per minute, respirations 18 per minute, blood pressure 125/90 mmHg.  He is saturating 91% on 2 liters oxygen via nasal cannula.  GENERAL:  The patient is a 49 year old male lying in the bed comfortably without any acute distress.  EYES:  Pupils equal, round, reactive to light and accommodation.  No scleral icterus.  Extraocular muscles intact. HENT:  Head atraumatic, normocephalic.  Oropharynx and nasopharynx clear.  NECK:  Supple.  No jugular venous distention.  No thyroid enlargement or tenderness.  LUNGS:  Clear to auscultation bilaterally.  No wheezing, rales, rhonchi, or crepitation.  ABDOMEN:  Soft, nontender, nondistended.  Bowel sounds present.  No organomegaly or masses.  EXTREMITIES:  No pedal edema, cyanosis or clubbing.  CARDIOVASCULAR:  S1, S2 normal.  No murmurs, rubs, gallops.  NEUROLOGIC:  Cranial nerves II through XII intact.  Muscle strength 5 out of 5.  Lower extremity sensation  intact.  PSYCHIATRIC:  The patient is alert, oriented to time, place, and person x 3.   LABORATORY AND RADIOLOGICAL DATA:  Normal BMP, except sodium 134, creatinine of 1.48.  Normal liver function tests except alkaline phosphatase of 161.  TSH of 2.28.  Normal CBC.  Negative UA.   Chest x-ray April 10th showed mild atelectasis at both lung bases.  Small pneumothorax cannot be excluded.   CT scan of the chest on April 10th showed no pneumothorax.  Emphysematous changes in both lungs with  bullous lesions in the apex.   Interstitial pneumonia in the right lower lobe posteriorly with patchy density in the costophrenic gutter in the left and in the lingula.  Scattered noncalcified nodules measuring 5 mm in diameter in both lungs.   IMPRESSION AND PLAN: 1.  Acute on chronic respiratory failure, likely due to chronic obstructive pulmonary disease exacerbation and/or pneumonia, likely due to chronic obstructive pulmonary disease exacerbation with underlying pneumonia.  We will start him on Duoneb breathing treatment, Advair, Spiriva and Levaquin at this time.  Hold all steroids.  He is not wheezing.  2.  Interstitial pneumonia seen on CT of the chest.  We will start him on Levaquin.  Get sputum culture if able.  We will hold on blood culture as he does not seem toxic at this time.  3.  Chronic obstructive pulmonary disease exacerbation.  He does not have any wheezing.  We will start him on Advair, Spiriva and nebulizer breathing treatment.  We will consider steroids if he does not improve or start having any wheezing.  4.  Hypertension, well-controlled.  5.  Tobacco abuse.  He was counseled for about three minutes.  He denied any need of nicotine patch at this time.  6.  Acute renal failure.  He was encouraged to have good oral intake.  This should improve.  This is likely prerenal in nature.   TOTAL TIME TAKING CARE OF THIS PATIENT:  Is 55 minutes.   CODE STATUS:  FULL CODE.     ____________________________ Deric Bocock S. Sherryll Burger, MD vss:ea D: 08/31/2012 17:05:42 ET T: 09/01/2012 00:48:17 ET JOB#: [id number]  cc: Mikiala Fugett S. Sherryll Burger, MD, <Dictator> Dr. Derrick Ravel [facility] MD ELECTRONICALLY SIGNED 09/03/2012 9:38

## 2014-09-12 NOTE — Consult Note (Signed)
Brief Consult Note: Diagnosis: chronic obstructive pulmonary disease.   Patient was seen by consultant.   Consult note dictated.   Recommend further assessment or treatment.   Orders entered.   Comments: 1. acute on chronic respi. failure exacerbation: likely due to 2,3, uses 2 liters O2 continuous at home.  2. interstitial Pneumonia: seen on CT chest, levaquin for now. sputum c/s if able. hold off blood c/s as doesn't seem toxic  3. chronic obstructive pulmonary disease exacerbation: no wheezing. start nebs, advair + spiriva. consider steroids if no improvement  4. hypertension: controlled,  5. tobacco abuse: counselled for 3 mins, denies any need for nicotine patch.  Electronic Signatures: Patricia PesaShah, Andrina Locken S (MD)  (Signed 11-Apr-14 17:04)  Authored: Brief Consult Note   Last Updated: 11-Apr-14 17:04 by Patricia PesaShah, Chelcy Bolda S (MD)

## 2014-09-12 NOTE — Discharge Summary (Signed)
PATIENT NAME:  Joseph [name], Joseph [name] MR#:  [id number] DATE OF BIRTH:  12/29/1965  DATE OF ADMISSION:  08/29/2012  DATE OF DISCHARGE:  09/01/2012  HOSPITAL COURSE:  See dictated history and physical for details of admission. A 49 year old man with a history of schizophrenia, was admitted voluntarily through my office. The patient was having worsening auditory hallucinations with some suicidal ideation. In the hospital, the patient's medication was adjusted somewhat. He had his Zyprexa dose increased. He was engaged in daily group and individual therapy. The patient was withdrawn at first, but quickly improved. He engaged appropriately in some groups. Did not show any suicidal behavior. The patient requested discharge after a couple of days stating that his hallucinations were gone, or at least completely under control, and he felt comfortable with his medication. The patient was educated on the importance of staying on his medicine and avoiding other substance abuse.   MEDICINES AT DISCHARGE:  Levofloxacin 500 mg daily for another 5 days for pneumonia diagnosed in the hospital, Spiriva HandiHaler 1 capsule daily, Advair Diskus 250/50 inhaler 1 puff twice a day, Zyprexa 50 mg at night, clonazepam 0.5 mg 3 times a day, albuterol inhaler 2 puffs q.4 hours p.r.n. shortness of breath while awake, mirtazapine 30 mg p.o. at bedtime, gabapentin 300 mg p.o. q.i.[name]., Celexa 40 mg p.o. daily, baclofen 10 mg p.o. t.i.[name].   LABORATORY RESULTS:  Admission labs showed a white blood cell count normal. CBC normal except for an MCV chronically low at 76, but not anemic. Chemistry panel showed a creatinine slightly high at 1.48, sodium low at 134. Alkaline phosphatase elevated at 161. Lipid panel showed elevated triglycerides at 263, normal cholesterol 169, low HDL at 33, elevated VLDL at 53.   MENTAL STATUS EXAMINATION AT DISCHARGE:  Neatly dressed and groomed man, looks older than his stated age. Cooperative but passive. Eye contact  good. Psychomotor activity slow. Speech slow. Thoughts: Some thought blocking, slowness, lack of volition. Denies auditory or visual hallucinations. Denies suicidal or homicidal ideation. Shows improved judgment and insight, probably at baseline. Average intelligence. Alert and oriented x 4.   DIAGNOSIS, PRINCIPAL AND PRIMARY:  AXIS I:  Schizophrenia, undifferentiated.   SECONDARY DIAGNOSES: AXIS I:   1.  Major depression, moderate, recurrent.  2.  History of substance dependence, in partial remission.  AXIS II:  Deferred.  AXIS III:  Pneumonia in healing, chronic obstructive pulmonary disease.  AXIS IV:  Severe from chronic illness, limited social support and financial support.  AXIS V:  Functioning at time of discharge 50.    ____________________________ Audery AmelJohn T. Yeilyn Gent, MD jtc:dmm [name]: 09/20/2012 16:40:43 ET T: 09/20/2012 20:16:02 ET JOB#: [id number]  cc: Audery AmelJohn T. Glayds [place], MD, <Dictator> Audery AmelJOHN T Derwood Becraft MD ELECTRONICALLY SIGNED 09/20/2012 21:59
